# Patient Record
Sex: Female | Born: 1980
Health system: Southern US, Community
[De-identification: ages and names within clinical notes are randomized; demographics above are authoritative.]

## PROBLEM LIST (undated history)

## (undated) DIAGNOSIS — T4145XA Adverse effect of unspecified anesthetic, initial encounter: Secondary | ICD-10-CM

## (undated) DIAGNOSIS — J302 Other seasonal allergic rhinitis: Secondary | ICD-10-CM

## (undated) DIAGNOSIS — N809 Endometriosis, unspecified: Secondary | ICD-10-CM

## (undated) DIAGNOSIS — A749 Chlamydial infection, unspecified: Secondary | ICD-10-CM

## (undated) DIAGNOSIS — N871 Moderate cervical dysplasia: Secondary | ICD-10-CM

## (undated) DIAGNOSIS — R51 Headache: Secondary | ICD-10-CM

## (undated) DIAGNOSIS — N3281 Overactive bladder: Secondary | ICD-10-CM

## (undated) DIAGNOSIS — J189 Pneumonia, unspecified organism: Secondary | ICD-10-CM

## (undated) DIAGNOSIS — S62501A Fracture of unspecified phalanx of right thumb, initial encounter for closed fracture: Secondary | ICD-10-CM

## (undated) DIAGNOSIS — R519 Headache, unspecified: Secondary | ICD-10-CM

## (undated) DIAGNOSIS — B009 Herpesviral infection, unspecified: Secondary | ICD-10-CM

## (undated) DIAGNOSIS — D649 Anemia, unspecified: Secondary | ICD-10-CM

## (undated) DIAGNOSIS — M1990 Unspecified osteoarthritis, unspecified site: Secondary | ICD-10-CM

## (undated) DIAGNOSIS — K219 Gastro-esophageal reflux disease without esophagitis: Secondary | ICD-10-CM

## (undated) DIAGNOSIS — T8859XA Other complications of anesthesia, initial encounter: Secondary | ICD-10-CM

## (undated) DIAGNOSIS — R32 Unspecified urinary incontinence: Secondary | ICD-10-CM

## (undated) HISTORY — DX: Endometriosis, unspecified: N80.9

## (undated) HISTORY — DX: Chlamydial infection, unspecified: A74.9

## (undated) HISTORY — PX: TUBAL LIGATION: SHX77

## (undated) HISTORY — DX: Herpesviral infection, unspecified: B00.9

## (undated) HISTORY — DX: Overactive bladder: N32.81

## (undated) HISTORY — DX: Unspecified urinary incontinence: R32

## (undated) HISTORY — DX: Moderate cervical dysplasia: N87.1

## (undated) HISTORY — PX: COLPOSCOPY: SHX161

---

## 2000-02-22 HISTORY — PX: CERVICAL BIOPSY  W/ LOOP ELECTRODE EXCISION: SUR135

## 2002-02-06 ENCOUNTER — Other Ambulatory Visit: Admission: RE | Admit: 2002-02-06 | Discharge: 2002-02-06 | Payer: Self-pay | Admitting: *Deleted

## 2002-02-21 DIAGNOSIS — N871 Moderate cervical dysplasia: Secondary | ICD-10-CM

## 2002-02-21 HISTORY — PX: OTHER SURGICAL HISTORY: SHX169

## 2002-02-21 HISTORY — DX: Moderate cervical dysplasia: N87.1

## 2002-07-15 ENCOUNTER — Inpatient Hospital Stay (HOSPITAL_COMMUNITY): Admission: AD | Admit: 2002-07-15 | Discharge: 2002-07-15 | Payer: Self-pay | Admitting: Gynecology

## 2002-07-17 ENCOUNTER — Observation Stay (HOSPITAL_COMMUNITY): Admission: AD | Admit: 2002-07-17 | Discharge: 2002-07-18 | Payer: Self-pay | Admitting: Gynecology

## 2002-10-04 ENCOUNTER — Inpatient Hospital Stay (HOSPITAL_COMMUNITY): Admission: AD | Admit: 2002-10-04 | Discharge: 2002-10-05 | Payer: Self-pay | Admitting: Gynecology

## 2002-10-13 ENCOUNTER — Inpatient Hospital Stay (HOSPITAL_COMMUNITY): Admission: AD | Admit: 2002-10-13 | Discharge: 2002-10-15 | Payer: Self-pay | Admitting: Gynecology

## 2002-10-13 ENCOUNTER — Encounter (INDEPENDENT_AMBULATORY_CARE_PROVIDER_SITE_OTHER): Payer: Self-pay | Admitting: Specialist

## 2002-11-25 ENCOUNTER — Other Ambulatory Visit: Admission: RE | Admit: 2002-11-25 | Discharge: 2002-11-25 | Payer: Self-pay | Admitting: Gynecology

## 2003-01-21 ENCOUNTER — Encounter (INDEPENDENT_AMBULATORY_CARE_PROVIDER_SITE_OTHER): Payer: Self-pay | Admitting: *Deleted

## 2003-01-21 ENCOUNTER — Ambulatory Visit (HOSPITAL_BASED_OUTPATIENT_CLINIC_OR_DEPARTMENT_OTHER): Admission: RE | Admit: 2003-01-21 | Discharge: 2003-01-21 | Payer: Self-pay | Admitting: Gynecology

## 2003-07-28 ENCOUNTER — Other Ambulatory Visit: Admission: RE | Admit: 2003-07-28 | Discharge: 2003-07-28 | Payer: Self-pay | Admitting: Gynecology

## 2004-01-27 ENCOUNTER — Ambulatory Visit: Payer: Self-pay | Admitting: Family Medicine

## 2004-02-20 ENCOUNTER — Other Ambulatory Visit: Admission: RE | Admit: 2004-02-20 | Discharge: 2004-02-20 | Payer: Self-pay | Admitting: Gynecology

## 2004-08-02 ENCOUNTER — Ambulatory Visit: Payer: Self-pay | Admitting: Family Medicine

## 2004-08-27 ENCOUNTER — Other Ambulatory Visit: Admission: RE | Admit: 2004-08-27 | Discharge: 2004-08-27 | Payer: Self-pay | Admitting: Gynecology

## 2005-09-02 ENCOUNTER — Other Ambulatory Visit: Admission: RE | Admit: 2005-09-02 | Discharge: 2005-09-02 | Payer: Self-pay | Admitting: Gynecology

## 2006-10-11 ENCOUNTER — Other Ambulatory Visit: Admission: RE | Admit: 2006-10-11 | Discharge: 2006-10-11 | Payer: Self-pay | Admitting: Gynecology

## 2007-11-06 ENCOUNTER — Other Ambulatory Visit: Admission: RE | Admit: 2007-11-06 | Discharge: 2007-11-06 | Payer: Self-pay | Admitting: Gynecology

## 2007-11-06 ENCOUNTER — Ambulatory Visit: Payer: Self-pay | Admitting: Gynecology

## 2007-11-06 ENCOUNTER — Encounter: Payer: Self-pay | Admitting: Gynecology

## 2007-11-23 ENCOUNTER — Ambulatory Visit: Payer: Self-pay | Admitting: Gynecology

## 2008-03-10 ENCOUNTER — Ambulatory Visit: Payer: Self-pay | Admitting: Women's Health

## 2008-04-01 ENCOUNTER — Ambulatory Visit: Payer: Self-pay | Admitting: Gynecology

## 2008-04-07 ENCOUNTER — Ambulatory Visit: Payer: Self-pay | Admitting: Gynecology

## 2008-04-09 ENCOUNTER — Ambulatory Visit: Payer: Self-pay | Admitting: Gynecology

## 2008-04-15 ENCOUNTER — Ambulatory Visit: Payer: Self-pay | Admitting: Gynecology

## 2008-05-07 ENCOUNTER — Ambulatory Visit: Payer: Self-pay | Admitting: Gynecology

## 2008-05-26 ENCOUNTER — Ambulatory Visit: Payer: Self-pay | Admitting: Women's Health

## 2008-05-29 ENCOUNTER — Ambulatory Visit: Payer: Self-pay | Admitting: Gynecology

## 2008-08-22 ENCOUNTER — Ambulatory Visit: Payer: Self-pay | Admitting: Gynecology

## 2008-10-14 ENCOUNTER — Other Ambulatory Visit: Admission: RE | Admit: 2008-10-14 | Discharge: 2008-10-14 | Payer: Self-pay | Admitting: Gynecology

## 2008-10-14 ENCOUNTER — Ambulatory Visit: Payer: Self-pay | Admitting: Gynecology

## 2008-10-14 ENCOUNTER — Encounter: Payer: Self-pay | Admitting: Gynecology

## 2008-12-01 ENCOUNTER — Encounter: Payer: Self-pay | Admitting: Gynecology

## 2008-12-01 ENCOUNTER — Ambulatory Visit: Payer: Self-pay | Admitting: Gynecology

## 2008-12-01 ENCOUNTER — Other Ambulatory Visit: Admission: RE | Admit: 2008-12-01 | Discharge: 2008-12-01 | Payer: Self-pay | Admitting: Gynecology

## 2009-01-20 ENCOUNTER — Ambulatory Visit: Payer: Self-pay | Admitting: Gynecology

## 2009-05-06 ENCOUNTER — Ambulatory Visit: Payer: Self-pay | Admitting: Women's Health

## 2009-05-22 ENCOUNTER — Ambulatory Visit: Payer: Self-pay | Admitting: Women's Health

## 2009-09-28 ENCOUNTER — Ambulatory Visit: Payer: Self-pay | Admitting: Women's Health

## 2009-12-11 ENCOUNTER — Other Ambulatory Visit: Admission: RE | Admit: 2009-12-11 | Discharge: 2009-12-11 | Payer: Self-pay | Admitting: Gynecology

## 2009-12-11 ENCOUNTER — Ambulatory Visit: Payer: Self-pay | Admitting: Gynecology

## 2009-12-18 ENCOUNTER — Emergency Department (HOSPITAL_BASED_OUTPATIENT_CLINIC_OR_DEPARTMENT_OTHER)
Admission: EM | Admit: 2009-12-18 | Discharge: 2009-12-18 | Payer: Self-pay | Source: Home / Self Care | Admitting: Emergency Medicine

## 2009-12-18 ENCOUNTER — Ambulatory Visit: Payer: Self-pay | Admitting: Gynecology

## 2009-12-22 ENCOUNTER — Ambulatory Visit: Payer: Self-pay | Admitting: Gynecology

## 2010-01-01 ENCOUNTER — Ambulatory Visit: Payer: Self-pay | Admitting: Gynecology

## 2010-02-08 ENCOUNTER — Inpatient Hospital Stay (HOSPITAL_COMMUNITY)
Admission: AD | Admit: 2010-02-08 | Discharge: 2010-02-08 | Payer: Self-pay | Source: Home / Self Care | Attending: Obstetrics and Gynecology | Admitting: Obstetrics and Gynecology

## 2010-02-14 ENCOUNTER — Inpatient Hospital Stay (HOSPITAL_COMMUNITY)
Admission: AD | Admit: 2010-02-14 | Discharge: 2010-02-14 | Payer: Self-pay | Source: Home / Self Care | Attending: Obstetrics and Gynecology | Admitting: Obstetrics and Gynecology

## 2010-02-16 ENCOUNTER — Inpatient Hospital Stay (HOSPITAL_COMMUNITY)
Admission: AD | Admit: 2010-02-16 | Discharge: 2010-02-16 | Payer: Self-pay | Source: Home / Self Care | Attending: Obstetrics and Gynecology | Admitting: Obstetrics and Gynecology

## 2010-04-24 ENCOUNTER — Inpatient Hospital Stay (HOSPITAL_COMMUNITY)
Admission: AD | Admit: 2010-04-24 | Discharge: 2010-04-24 | Disposition: A | Discharge status: Home / Self Care | Payer: Managed Care, Other (non HMO) | Source: Ambulatory Visit | Attending: Obstetrics and Gynecology | Admitting: Obstetrics and Gynecology

## 2010-04-24 ENCOUNTER — Inpatient Hospital Stay (HOSPITAL_COMMUNITY): Payer: Managed Care, Other (non HMO)

## 2010-04-24 DIAGNOSIS — R109 Unspecified abdominal pain: Secondary | ICD-10-CM | POA: Insufficient documentation

## 2010-04-24 DIAGNOSIS — O99891 Other specified diseases and conditions complicating pregnancy: Secondary | ICD-10-CM | POA: Insufficient documentation

## 2010-04-24 LAB — CBC
HCT: 28.6 % — ABNORMAL LOW (ref 36.0–46.0)
Hemoglobin: 9.5 g/dL — ABNORMAL LOW (ref 12.0–15.0)
MCV: 84.1 fL (ref 78.0–100.0)
RBC: 3.4 MIL/uL — ABNORMAL LOW (ref 3.87–5.11)
RDW: 14.3 % (ref 11.5–15.5)
WBC: 10.6 10*3/uL — ABNORMAL HIGH (ref 4.0–10.5)

## 2010-04-24 LAB — DIC (DISSEMINATED INTRAVASCULAR COAGULATION)PANEL
Fibrinogen: 405 mg/dL (ref 204–475)
INR: 1.08 (ref 0.00–1.49)
Platelets: 250 10*3/uL (ref 150–400)
Smear Review: NONE SEEN
aPTT: 30 seconds (ref 24–37)

## 2010-04-24 LAB — URINALYSIS, ROUTINE W REFLEX MICROSCOPIC
Glucose, UA: NEGATIVE mg/dL
Hgb urine dipstick: NEGATIVE
Protein, ur: NEGATIVE mg/dL
pH: 7 (ref 5.0–8.0)

## 2010-04-26 ENCOUNTER — Inpatient Hospital Stay (HOSPITAL_COMMUNITY): Payer: Managed Care, Other (non HMO)

## 2010-05-03 LAB — URINALYSIS, ROUTINE W REFLEX MICROSCOPIC
Bilirubin Urine: NEGATIVE
Bilirubin Urine: NEGATIVE
Hgb urine dipstick: NEGATIVE
Ketones, ur: NEGATIVE mg/dL
Nitrite: NEGATIVE
Protein, ur: NEGATIVE mg/dL
Protein, ur: NEGATIVE mg/dL
Specific Gravity, Urine: 1.015 (ref 1.005–1.030)
Urobilinogen, UA: 0.2 mg/dL (ref 0.0–1.0)
Urobilinogen, UA: 1 mg/dL (ref 0.0–1.0)

## 2010-05-03 LAB — URINE MICROSCOPIC-ADD ON

## 2010-05-03 LAB — ABO/RH: ABO/RH(D): A POS

## 2010-05-03 LAB — WET PREP, GENITAL
Trich, Wet Prep: NONE SEEN
Yeast Wet Prep HPF POC: NONE SEEN

## 2010-05-03 LAB — GC/CHLAMYDIA PROBE AMP, GENITAL
Chlamydia, DNA Probe: UNDETERMINED
GC Probe Amp, Genital: NEGATIVE
GC Probe Amp, Genital: UNDETERMINED

## 2010-05-03 LAB — CBC
Hemoglobin: 11.1 g/dL — ABNORMAL LOW (ref 12.0–15.0)
MCH: 26.6 pg (ref 26.0–34.0)
MCHC: 33.8 g/dL (ref 30.0–36.0)
MCV: 78.5 fL (ref 78.0–100.0)
RBC: 4.18 MIL/uL (ref 3.87–5.11)

## 2010-05-05 LAB — URINALYSIS, ROUTINE W REFLEX MICROSCOPIC
Bilirubin Urine: NEGATIVE
Hgb urine dipstick: NEGATIVE
Ketones, ur: 15 mg/dL — AB
Nitrite: NEGATIVE
Specific Gravity, Urine: 1.021 (ref 1.005–1.030)
pH: 7 (ref 5.0–8.0)

## 2010-05-05 LAB — PREGNANCY, URINE: Preg Test, Ur: POSITIVE

## 2010-07-09 NOTE — Discharge Summary (Signed)
   NAMEVAUDIE, ENGEBRETSEN                            ACCOUNT NO.:  [id number]   MEDICAL RECORD NO.:  [id number]                   PATIENT TYPE:  INP   LOCATION:  9160                                 FACILITY:  [facility]   PHYSICIAN:  Ivor Costa. Farrel Gobble, M.D.              DATE OF BIRTH:  05-Jun-1980   DATE OF ADMISSION:  10/04/2002  DATE OF DISCHARGE:  10/05/2002                                 DISCHARGE SUMMARY   DISCHARGE DIAGNOSES:  1. Intrauterine pregnancy 35 weeks 5 days, undelivered.  2. Preterm labor.   HISTORY:  This is a 30 year old female gravida 2, para 0 with an EDC of  November 06, 2002.  Prenatal course complicated by prior admission for  preterm labor which she had been treated with terbutaline.  Also, had a  history of recurrent urinary tract infections which at that time had been  felt source of her contractions.  Of note, since 23 weeks the patient had  short cervix compared to her anatomy done at 19 weeks when her cervix had  been normal.  Cervix was noted to be short at 1.9 cm at 28 weeks.  The  patient had been maintained on pelvic rest, limited activity.   HOSPITAL COURSE:  On October 04, 2002 patient presented at 35 weeks and 5  days complaining of pelvic pain and pressure.  Was noted to be contracting  every one to two minutes with a vaginal examination of 2-3 cm.  Based on  that patient was admitted and begun on IV antibiotics, IV fluids and was  monitored.  On October 05, 2002 patient was feeling better, less pressure.  Nonstress test was reactive with no decelerations.  Contractions had  decreased to every 12 minutes and vaginal examination was fingertip, 80%,  and -3.  At that point the diagnosis of preterm labor with dynamic cervical  changes was made.  The patient was felt stable for discharge to home on  limited activity.  She is to follow up in the office on Monday unless she  has problems prior to that time.     Susa Loffler, P.A.                    Ivor Costa. Farrel Gobble, M.D.    TSG/MEDQ  D:  11/22/2002  T:  11/22/2002  Job:  [id number]

## 2010-07-09 NOTE — H&P (Signed)
[name], [name]                            ACCOUNT NO.:  [id number]   MEDICAL RECORD NO.:  [id number]                   PATIENT TYPE:  OBV   LOCATION:  9153                                 [facility]ACILITY:  [facility]   PHYSICIAN:  Timothy P. Fontaine, M.D.           DATE OF BIRTH:  1980/08/16   DATE OF ADMISSION:  07/17/2002  DATE OF DISCHARGE:                                HISTORY & PHYSICAL   CHIEF COMPLAINT:  Cramping.   HISTORY OF PRESENT ILLNESS:  A 30 year old at 24 weeks with lower pelvic  cramping.  The patient has been evaluated this week in the triage with some  uterine irritability, was given terbutaline x1 which resolved her cramping,  and was sent home.  She had BV per wet prep and was also given Cleocin p.o.  The patient was unable to take her Cleocin due to nausea and vomiting after  each dose and re-presented complaining of some cramping.  Her exam showed  her cervix to be closed although there was some ballooning of the lower  uterine segment and ultrasound was obtained which showed the cervix at 2.6  cm in length, closed.  External monitors did show some uterine irritability  with cramping every one to three minutes.  Of note also, her urine culture  which had been obtained in the triage area ultimately returned showing E.  coli.  The patient was admitted for IV antibiotics and p.o. terbutaline  suppression.   For the remainder of her history, see her [facility].   PHYSICAL EXAMINATION:  VITAL SIGNS:  Afebrile, vital signs are stable.  HEENT:  Normal.  LUNGS:  Clear.  CARDIAC:  Regular rate without rubs, murmurs, or gallops.  ABDOMEN:  Benign.  Uterus appropriate for dates, soft, nontender.  PELVIC:  Cervix is long, closed, with the ballooning of the lower uterine  segment.   ASSESSMENT AND PLAN:  A 30 year old at 24 weeks, urinary tract infection,  uterine irritability, ballooning of the lower uterine segment, cervix 2.7  cm, closed on ultrasound.  The patient has  received oral terbutaline 2.5  q.4h. and was begun on Cefotan 1 gram IV q.12h.  On reassessment the morning  following admission the patient was found to be without contractions and she  will receive a second dose of IV antibiotics, will be converted to p.o. of  Macrobid 100 b.i.d. x7days.  Will treat her BV with Cleocin vaginal ovules  given her intolerance to Cleocin.  Will continue her oral terbutaline for  four days to allow antibiotics to take effect and then will discontinue, and  then will be seen in the office the day following her discontinuation of her  terbutaline.  The patient was given precautions to call ASAP if any returned  cramping or discomfort or any other symptoms.  Timothy P. Audie Box, M.D.    TPF/MEDQ  D:  07/18/2002  T:  07/18/2002  Job:  [id number]

## 2010-07-09 NOTE — Discharge Summary (Signed)
   [name], [name]                            ACCOUNT NO.:  [id number]   MEDICAL RECORD NO.:  [id number]                   PATIENT TYPE:  INP   LOCATION:  9105                                 FACILITY:  [facility]   PHYSICIAN:  Ivor Costa. Farrel Gobble, M.D.              DATE OF BIRTH:  1980-11-07   DATE OF ADMISSION:  10/13/2002  DATE OF DISCHARGE:  10/15/2002                                 DISCHARGE SUMMARY   DISCHARGE DIAGNOSIS:  Intrauterine pregnancy at 36-and-a-half weeks with  labor.   PROCEDURE:  Spontaneous delivery with an episiotomy and a second degree  laceration.   HISTORY OF PRESENT ILLNESS:  A 30 year old gravida 2 para 0 at 36-and-a-half  weeks admitted with labor and 2-3 cm at admission.  She was augmented with  Pitocin and artificial rupture of membranes.  She progressed to complete.  Prenatally, her prenatal course was complicated with some preterm labor that  resolved with increased rest.   PRENATAL LABORATORY DATA:  Blood type is A positive, antibody is negative.  Serology is nonreactive.  Rubella titer immune.  Hepatitis negative.  HIV  negative.  Group B strep was unknown.   HOSPITAL COURSE AND TREATMENT:  The patient presented on October 13, 2002  with preterm labor at 67 and three days.  Her EDC was November 06, 2002.  She was found to be 2-3 cm, was admitted.  Delivered a viable female with  Apgars of 9 and 9 with a birth weight of 5 pounds 11 ounces.  Postpartum she  remained afebrile, no difficulty voiding, and was discharged in satisfactory  condition on postpartum day #2.   DISCHARGE LABORATORY DATA:  Her white count was 14.5, hemoglobin was 10.9,  hematocrit 30.9, platelets 238.   DISPOSITION:  She was discharged to home with instructions to follow up in  six weeks, continue prenatal vitamins and iron, Motrin as needed for pain,  GGA discharge booklet.     Davonna Belling. Young, N.P.                      Ivor Costa. Farrel Gobble, M.D.    [facility]  D:  10/31/2002  T:   10/31/2002  Job:  [id number]

## 2010-07-09 NOTE — Op Note (Signed)
NAME:  [name], [name]                          ACCOUNT NO.:  [id number]   MEDICAL RECORD NO.:  [id number]                   PATIENT TYPE:  AMB   LOCATION:  N[facility]                                 FACILITY:  [facility]   PHYSICIAN:  Ivor Costa. Farrel Gobble, M.D.              DATE OF BIRTH:  1980/12/01   DATE OF PROCEDURE:  01/21/2003  DATE OF DISCHARGE:                                 OPERATIVE REPORT   PREOPERATIVE DIAGNOSES:  1. Cervical intraepithelial neoplasia II on biopsy.  2. Previous loop electrosurgical excision procedure   POSTOPERATIVE DIAGNOSES:  1. Cervical intraepithelial neoplasia II on biopsy.  2. Previous loop electrosurgical excision procedure.   PROCEDURE:  Cold knife cone.   SURGEON:  Ivor Costa. Farrel Gobble, M.D.   ANESTHESIA:  MAC with paracervical block.   ESTIMATED BLOOD LOSS:  Minimal.   FINDINGS:  The uterus was retroverted, sounding 8 cm, with a scarred  cervical squamocolumnar junction.   PATHOLOGY:  Cold knife cone.   DESCRIPTION OF PROCEDURE:  The patient was taken to the operating room, MAC  anesthesia was induced, placed in the dorsal lithotomy position, prepped and  draped in the usual sterile fashion, the vagina was cleansed with Hibiclens.  A bivalve speculum was then placed in the vagina.  The cervix was markedly  deviated underneath the pubic symphysis.  The posterior aspect of the cervix  was somewhat flush to the vagina.  The cervix was stabilized with a single-  tooth tenaculum and the canal was identified with a uterine sound.  The  uterus was noted to be markedly retroflexed.  A paracervical block with  twenty micrograms in 50 mL of Pitressin solution was injected at 3 and 9  o'clock.  A sterile weighted speculum was then placed in the vagina, as was  the Sims.  The cervix was better visualized and again was re-stabilized with  a single-tooth tenaculum.  Once blanching of the cervix was appreciated, the  scar from the previous LEEP was readily visible.   The cervical mucosa was  gently scored circumferentially around this scar and then once it was able  to be grasped, it was grasped with a pickup and the cold knife cone was done  sharply, excising the previous scar.  It was removed in one piece.  It was  tagged at 12 o'clock.  The canal was then re-probed.  The cervix was noted  to be hemostatic.  It was packed with two Gelfoams, which occluded the  space, and a single suture of 2-0 chromic was placed gently to hold the  Gelfoam in place.  Next 20 mL of 0.5%  Marcaine solution was injected circumferentially for pain relief.  The  vagina was then packed with a vaginal packing with Estrace cream.  A Foley  was placed. The patient tolerated the procedure well.  Sponge, lap, and  needle counts correct x2.  She was transferred to the  PACU in stable  condition.                                               Ivor Costa. Farrel Gobble, M.D.    THL/MEDQ  D:  01/21/2003  T:  01/21/2003  Job:  [id number]

## 2010-07-09 NOTE — H&P (Signed)
NAMEPREET, MANGANO                            ACCOUNT NO.:  [id number]   MEDICAL RECORD NO.:  [id number]                   PATIENT TYPE:  INP   LOCATION:  9160                                 FACILITY:  [facility]   PHYSICIAN:  Ivor Costa. Farrel Gobble, M.D.              DATE OF BIRTH:  April 10, 1980   DATE OF ADMISSION:  10/04/2002  DATE OF DISCHARGE:                                HISTORY & PHYSICAL   CHIEF COMPLAINT:  Uterine contractions.   HISTORY OF PRESENT ILLNESS:  The patient is a 30 year old G2, P0 with an LMP  of April 01, 2001, estimated date of confinement is November 06, 2002,  estimated gestational age of 35-5/7 weeks, who called the office this  afternoon complaining of some pelvic pain and pressure. The patient was  asked to come to triage and she was noted to be contracting every 1 to 2  minutes with a vaginal examination of 2 to  3 cm. Based on that the patient  was admitted.   The patient's pregnancy has been complicated by a previous admission for  preterm labor for which she was treated with terbutaline. She has also had  recurrent urinary tract infections which was felt  to be the source of her  uterine contractions. She was not maintained on terbutaline.   Of note since 23 weeks the patient has had a shortened cervix compared to  her anatomy done at 19 weeks her cervix had been normal. The patient's  cervix was noted to be 2.6 cm. She has had multiple  ultrasounds done. Her  cervix was as short at 1.9 cm at 28 weeks.   The patient had been maintained on pelvic rest and limited activity. The  patient is without complaints. She reports good fetal movement, no vaginal  bleeding and no loss of fluid.   LABORATORY DATA:  Prenatal labs, she is A positive, antibody negative. RPR  nonreactive, rubella immune, hepatitis B surface antigen nonreactive, HIV  nonreactive, GBS not available. Please refer to [facility].  Urinalysis  shows leukocyte esterase, few WBCs and few  bacteria.   PHYSICAL EXAMINATION:  GENERAL:  She is a well appearing gravida in no acute  distress.  HEART:  Regular rate.  LUNGS:  Clear to auscultation.  ABDOMEN:  Gravid, soft, nontender.  PELVIC:  On vaginal examination  she was 280-3, posterior and hot. Membranes  were palpable. Her NST is reactive with a baseline  of 130, accelerations to  180. No decelerations. Contractions as mentioned above.  EXTREMITIES:  No edema.   ASSESSMENT:  History of recurrent urinary tract infections with previous  preterm labor, bulging lower uterine segment and shortened cervix. The  patient now has advanced  dilatation. A discussion with the patient with  regard to the fact that she is still  considered preterm. There is risk of  pulmonary immaturity versus risk of tocolysis. The patient elects to  allow  delivery if she goes into spontaneous labor. We will not augment her at this  point, however, but we will not tocolyze her.    PLAN:  She was admitted for IV antibiotics, IV fluids and we will reassess  her later during this admission.  All questions were addressed.                                               Ivor Costa. Farrel Gobble, M.D.    THL/MEDQ  D:  10/04/2002  T:  10/04/2002  Job:  [id number]

## 2010-08-07 ENCOUNTER — Inpatient Hospital Stay (HOSPITAL_COMMUNITY)
Admission: AD | Admit: 2010-08-07 | Discharge: 2010-08-07 | Disposition: A | Discharge status: Home / Self Care | Payer: Managed Care, Other (non HMO) | Source: Ambulatory Visit | Attending: Obstetrics and Gynecology | Admitting: Obstetrics and Gynecology

## 2010-08-07 DIAGNOSIS — O479 False labor, unspecified: Secondary | ICD-10-CM | POA: Insufficient documentation

## 2010-08-15 ENCOUNTER — Other Ambulatory Visit: Payer: Self-pay | Admitting: Obstetrics and Gynecology

## 2010-08-15 ENCOUNTER — Inpatient Hospital Stay (HOSPITAL_COMMUNITY)
Admission: AD | Admit: 2010-08-15 | Discharge: 2010-08-17 | DRG: 775 | Disposition: A | Discharge status: Home / Self Care | Payer: Managed Care, Other (non HMO) | Source: Ambulatory Visit | Attending: Obstetrics and Gynecology | Admitting: Obstetrics and Gynecology

## 2010-08-15 DIAGNOSIS — O9903 Anemia complicating the puerperium: Secondary | ICD-10-CM | POA: Diagnosis not present

## 2010-08-15 DIAGNOSIS — O99892 Other specified diseases and conditions complicating childbirth: Secondary | ICD-10-CM | POA: Diagnosis present

## 2010-08-15 DIAGNOSIS — Z22330 Carrier of Group B streptococcus: Secondary | ICD-10-CM

## 2010-08-15 DIAGNOSIS — D62 Acute posthemorrhagic anemia: Secondary | ICD-10-CM | POA: Diagnosis not present

## 2010-08-15 LAB — CBC
HCT: 32 % — ABNORMAL LOW (ref 36.0–46.0)
Hemoglobin: 11 g/dL — ABNORMAL LOW (ref 12.0–15.0)
MCH: 29 pg (ref 26.0–34.0)
MCHC: 34.4 g/dL (ref 30.0–36.0)
MCV: 84.4 fL (ref 78.0–100.0)
RBC: 3.79 MIL/uL — ABNORMAL LOW (ref 3.87–5.11)

## 2010-08-15 LAB — RPR: RPR Ser Ql: NONREACTIVE

## 2010-08-16 LAB — CBC
HCT: 27.7 % — ABNORMAL LOW (ref 36.0–46.0)
Platelets: 216 10*3/uL (ref 150–400)
RDW: 16.5 % — ABNORMAL HIGH (ref 11.5–15.5)
WBC: 15.2 10*3/uL — ABNORMAL HIGH (ref 4.0–10.5)

## 2010-12-14 ENCOUNTER — Other Ambulatory Visit: Payer: Self-pay | Admitting: *Deleted

## 2010-12-14 MED ORDER — MEDROXYPROGESTERONE ACETATE 150 MG/ML IM SUSP: 150.0000 mg | Freq: Once | INTRAMUSCULAR | Status: DC

## 2010-12-24 ENCOUNTER — Telehealth: Payer: Self-pay | Admitting: *Deleted

## 2010-12-24 NOTE — Telephone Encounter (Signed)
PT CALLED WANTING TO KNOW WHY SHE NEEDED TO HAVE OFFICE VISIT TO GET DEPRO PROVERA SHOT. PT SAW DR. Odis Hollingshead FOR OB AND HAD HER INJECTION DONE THERE, NOW IT IS TIME FOR HER SECOND INJECTION WITH [facility]. I EXPLAINED TO PT THAT SHE STILL WOULD NEED TO HAVE AN EXAM WITH [facility] BECAUSE LAST OFFICE VISIT WITH HIM WAS OCT 2011.HE WOULD NOT BE ABLE TO GIVE HER JUST AN INJECTION WITHOUT EXAMATION. PT IS SAID SHE IS OKAY WITH THIS AS LONG AS HER INSURANCE PAYS FOR THIS OFFICE VISIT.

## 2010-12-27 ENCOUNTER — Encounter: Payer: Self-pay | Admitting: *Deleted

## 2010-12-27 DIAGNOSIS — N3281 Overactive bladder: Secondary | ICD-10-CM | POA: Insufficient documentation

## 2010-12-27 DIAGNOSIS — B009 Herpesviral infection, unspecified: Secondary | ICD-10-CM | POA: Insufficient documentation

## 2010-12-28 ENCOUNTER — Encounter: Payer: Self-pay | Admitting: Gynecology

## 2010-12-28 ENCOUNTER — Ambulatory Visit (INDEPENDENT_AMBULATORY_CARE_PROVIDER_SITE_OTHER): Payer: Managed Care, Other (non HMO) | Admitting: Gynecology

## 2010-12-28 VITALS — BP 124/70

## 2010-12-28 DIAGNOSIS — Z3049 Encounter for surveillance of other contraceptives: Secondary | ICD-10-CM

## 2010-12-28 DIAGNOSIS — N898 Other specified noninflammatory disorders of vagina: Secondary | ICD-10-CM

## 2010-12-28 DIAGNOSIS — B373 Candidiasis of vulva and vagina: Secondary | ICD-10-CM

## 2010-12-28 DIAGNOSIS — B3731 Acute candidiasis of vulva and vagina: Secondary | ICD-10-CM

## 2010-12-28 MED ORDER — FLUCONAZOLE 150 MG PO TABS: 150.0000 mg | ORAL_TABLET | Freq: Once | ORAL | Status: AC

## 2010-12-28 MED ORDER — MEDROXYPROGESTERONE ACETATE 150 MG/ML IM SUSP
150.0000 mg | Freq: Once | INTRAMUSCULAR | Status: AC
  Administered 2010-12-28: 150 mg via INTRAMUSCULAR

## 2010-12-28 NOTE — Progress Notes (Signed)
Patient presents having undergone a vaginal delivery in June and started on Depo-Provera for contraception and is due for her shot now. She's having no complaints. She relates having a Pap smear postpartum and was told it was normal. She does have a history of significant dysplasia in the past.  Exam External BUS vagina with white discharge KOH wet prep done, cervix normal, uterus normal size midline mobile nontender, adnexa without masses or tenderness  Assessment and plan: 1. White discharge. KOH wet prep positive for yeast we'll treat with Diflucan 150x1 dose. 2. Depo-Provera contraception. Patient brought her medication with her and we'll go ahead and give her shot now she'll represent in 3 months for her next shot. She'll represent in June for her annual. Sooner if she has any issues.

## 2010-12-28 NOTE — Progress Notes (Signed)
Addended by: [place], Icesis Renn E on: 12/28/2010 01:20 PM   Modules accepted: Orders

## 2011-03-07 ENCOUNTER — Telehealth: Payer: Self-pay | Admitting: *Deleted

## 2011-03-07 NOTE — Telephone Encounter (Signed)
Lm for pt to call

## 2011-03-07 NOTE — Telephone Encounter (Signed)
Pt informed with the below note. 

## 2011-03-07 NOTE — Telephone Encounter (Signed)
Pt calling c/o spotting Jan 1st thru 10 th and full flow period on Jan 11 until now. Pt had baby on June 24 th, she uses depo provera as birth control, had 2nd shot on 12/28/10. Pt said she has been on depo-provera since 31 years old and never had any bleeding until now. Pt is concerned. Please advise

## 2011-03-07 NOTE — Telephone Encounter (Signed)
I would recommend UPT now for completeness. Not unusual to have some irregularity on Depo-Provera. I would watch for now. Irregularity continues an office visit to evaluate

## 2011-03-23 ENCOUNTER — Other Ambulatory Visit: Payer: Self-pay | Admitting: Gynecology

## 2011-03-29 ENCOUNTER — Ambulatory Visit (INDEPENDENT_AMBULATORY_CARE_PROVIDER_SITE_OTHER): Payer: Medicaid Other | Admitting: Gynecology

## 2011-03-29 DIAGNOSIS — Z309 Encounter for contraceptive management, unspecified: Secondary | ICD-10-CM

## 2011-03-29 DIAGNOSIS — IMO0001 Reserved for inherently not codable concepts without codable children: Secondary | ICD-10-CM

## 2011-03-29 MED ORDER — MEDROXYPROGESTERONE ACETATE 150 MG/ML IM SUSP
150.0000 mg | Freq: Once | INTRAMUSCULAR | Status: AC
  Administered 2011-03-29: 150 mg via INTRAMUSCULAR

## 2011-04-06 ENCOUNTER — Encounter: Payer: Self-pay | Admitting: Gynecology

## 2011-04-06 ENCOUNTER — Ambulatory Visit (INDEPENDENT_AMBULATORY_CARE_PROVIDER_SITE_OTHER): Payer: Medicaid Other | Admitting: Gynecology

## 2011-04-06 VITALS — BP 118/76

## 2011-04-06 DIAGNOSIS — N61 Mastitis without abscess: Secondary | ICD-10-CM

## 2011-04-06 MED ORDER — DICLOXACILLIN SODIUM 500 MG PO CAPS: 500.0000 mg | ORAL_CAPSULE | Freq: Four times a day (QID) | ORAL | Status: AC

## 2011-04-06 NOTE — Progress Notes (Signed)
Patient is a 31 year old who on June 2012 had a normal spontaneous vaginal delivery and has been breast feeding since then. Yesterday she felt warm and had tenderness on her left breast. She documented a temperature of 102 and defervesced after 4 hours. Since then she has noticed a reddened area as well but no bloody discharge.  Breast exam: Physical Exam  Pulmonary/Chest:     Both breasts had been examined in sitting and supine position slight asymmetry. Left breast slightly larger than right. On questioning patient this is normal for her. Erythematous area was noted from 10:00 to 12:00. On examination it was tender to touch and slightly indurated. No bloody nipple discharge was noted. Contralateral breast no palpable masses or tenderness. Bilaterally there were no supraclavicular or axillary lymphadenopathy.  Assessment: Mastitis. Patient will be placed on antibiotic consisting of dicloxacillin 500 mg 4 times a day for 10 days. If within the next 2 days patient sees no improvement she will need to return to the office for possible consideration of an incision and drainage if an abscess develops. Patient will apply moist warm compresses intermittently during the day. She will use of breast problems breast. Literature information was provided all questions were answered we'll follow accordingly.

## 2011-04-06 NOTE — Patient Instructions (Signed)
Breastfeeding, Mastitis Breastfeeding is usually the best way to feed your baby. Breastfed babies tend to be healthier and less affected by disease. Breastfed babies may have better brain development and be less likely to be overweight than formula-fed babies. Breastfeeding also benefits the mother. Breastfeeding reduces the risk of breast cancer. It will give you time to be close to your baby and helps create a strong bond. It also delays the return of your periods, stimulates your uterus to contract back to normal, and helps you lose some of the weight you gained during pregnancy. Breastfeeding should not be painful. If there is tenderness at first, it should gradually go away as the days go by. Poor latch-on and positioning are common causes of sore nipples. This is usually because the baby is not getting enough of the areola (the colored portion around the nipple) into his or her mouth, and is sucking mostly on the nipple. In general, nurse early and often, nurse with the nipple and areola in the baby's mouth, not just the nipple and feed your baby on demand. CAUSES  It is common for many women to have a plugged duct in the breast at some point if she breastfeeds. A plugged milk duct feels like a tender, sore, lump in the breast. It is not accompanied by a fever or other symptoms. It happens when a milk duct does not properly drain, and the breast becomes inflamed (red and sore). Then, pressure builds up behind the plug. A plugged duct usually only occurs in one breast at a time.   A breast infection (mastitis), on the other hand, is soreness or a lump in the breast that can be accompanied by a fever or flu-like symptoms, such as feeling run down or very achy. Some women with a breast infection also have nausea and vomiting. You also may have yellowish discharge from the nipple that looks like colostrum, or the breasts feel warm or hot to the touch and appear pink or red. A breast infection can occur when  other family members have a cold or the flu, and like a plugged duct, it usually only occurs in one breast. It is not always easy to tell the difference between a breast infection and a plugged duct because both have similar symptoms and can improve within 24 to 48 hours.   Using one position to breastfeed may not empty your breasts properly or completely and could lead to engorgement and mastitis.   Wearing a bra that is too tight may restrict the flow of your milk.   Mastitis develops because bacteria get under the skin through cracks in the nipple and into the breasts and an infection develops.  TREATMENT    Breastfeed or pump the breast more often on the affected side. This helps loosen the plug, keeps the milk moving freely, and the breast from becoming overly full. Nursing every 2 hours, both day and night on the affected side first can be helpful.   Getting extra sleep or relaxing with your feet up can help speed healing. Often, a plugged duct or breast infection is the first sign that a mother is doing too much and becoming overly tired.   Wear a well-fitting, supportive bra that is not too tight, since this can constrict milk ducts.   If you do not feel better within 24 hours of trying these steps, and you have a fever or your symptoms worsen, call your caregiver. You may need an antibiotic. If you have   a breast infection in which both breasts look affected, or there is pus or blood in the milk, red streaks near the painful area, or your symptoms came suddenly, see your caregiver right away.   Even if you need an antibiotic, continuing to breastfeed during treatment is best for both you and your baby. Most antibiotics passed in your breast milk will not hurt your baby.   Increase your fluid intake especially if you have a fever   If mastitis is not treated quickly and properly, you may develop a breast abscess.  THRUSH   Thrush (yeast) is a fungal infection that can form on your  nipples or in your breast because it thrives on milk.   The infection forms from an overgrowth of the candida organism. Candida usually lives in our bodies and is kept at healthy levels by the natural bacteria in our bodies. But, when the natural balance of bacteria is upset, candida can overgrow, causing an infection. Some of the things that can cause thrush include:    Having an overly moist environment on your skin or nipples that are sore or cracked.   Taking antibiotics, birth control pills, or steroids.   Having a diet that contains large amounts of sugar or foods with yeast.   Women with diabetes have a higher incidence of thrush and fungal infections.   Having a chronic illness like HIV infection, diabetes, or anemia.  These are symptoms of thrush:  Having sore nipples that last more than a few days, even when your baby's latch and positioning is correct.   Getting sore nipples suddenly after several weeks of normal nursing.   Pink, flaky, shiny, itchy, or cracked nipples, or deep pink and blistered nipples.   Shooting pains deep in the breast during or after feedings, or achy breasts. This may be from a candida infection of the milk ducts.   The infection can form in your baby's mouth from having contact with your nipples, and appear as little white spots on the inside of the cheeks, gums, or tongue. It also can appear as a diaper rash (small red dots around a rash) on your baby that will not go away by using regular diaper rash ointments. Many babies with thrush refuse to nurse, or are gassy or cranky.  HOME CARE INSTRUCTIONS  If you or your baby have any of the above symptoms, contact your caregiver and your baby's caregiver so you both can be correctly diagnosed.   You can get medication for your nipples and for your baby. Medication for a mother may be an ointment for the nipples, and your baby can be given a liquid medication for his or her mouth, or an ointment for any  diaper rash.   Change disposable nursing pads often and wash any towels or clothing that have come in contact with the yeast in very hot water above 122 F (50 C).   Wear a clean bra every day.   Only use cotton pads.   Wash your hands often, and wash your baby's hands often, especially if he or she sucks on his or her fingers.   Boil any pacifiers, bottle nipples, or toys your baby puts in his or her mouth once a day for 20 minutes to kill the thrush. After 1 week of treatment, discard pacifiers and nipples and buy new ones. Boil all breast pump parts that touch the milk for 20 minutes daily.   Make sure other family members are free of   fungal infections. If they have symptoms, get treatment for them.  MAKE SURE YOU:   Understand these instructions.   Will watch your condition.   Will get help right away if you are not doing well or get worse.  Document Released: 06/04/2004 Document Revised: 10/20/2010 Document Reviewed: 10/24/2007 [facility] Patient Information 2012 [place], [place].

## 2011-05-12 ENCOUNTER — Other Ambulatory Visit: Payer: Self-pay | Admitting: Women's Health

## 2011-05-12 ENCOUNTER — Telehealth: Payer: Self-pay | Admitting: *Deleted

## 2011-05-12 DIAGNOSIS — B009 Herpesviral infection, unspecified: Secondary | ICD-10-CM

## 2011-05-12 MED ORDER — VALACYCLOVIR HCL 500 MG PO TABS: ORAL_TABLET | ORAL | Status: DC

## 2011-05-12 NOTE — Telephone Encounter (Signed)
Telephone call to review request for Valtrex for rare HSV outbreaks. Also states is weaning from breast-feeding had mastitis that has now resolved.

## 2011-05-12 NOTE — Telephone Encounter (Signed)
Pt called requesting to speak with you. Call back number [phone]

## 2011-05-23 ENCOUNTER — Telehealth: Payer: Self-pay | Admitting: *Deleted

## 2011-05-23 NOTE — Telephone Encounter (Signed)
Pt called to follow when next depo-provera shot is due pt informed around 06/26/2011.

## 2011-05-31 ENCOUNTER — Telehealth: Payer: Self-pay | Admitting: *Deleted

## 2011-05-31 NOTE — Telephone Encounter (Signed)
I have no idea. The best thing would be to avoid it while she's breast-feeding.

## 2011-05-31 NOTE — Telephone Encounter (Signed)
Pt is currently breast feeding and bought OTC medication called Relora all-natural dietary supplement to alleviate stress stress- related eating and promote weight management. Pt wanted to know if you thought it would be okay to take this while breast feeding? Pt said nonething on the bottle states not to take if breast feeding. But it does say do not take if pregnant. Please advise

## 2011-05-31 NOTE — Telephone Encounter (Signed)
Pt informed with the below note. 

## 2011-06-23 ENCOUNTER — Telehealth: Payer: Self-pay | Admitting: *Deleted

## 2011-06-23 MED ORDER — MEDROXYPROGESTERONE ACETATE 150 MG/ML IM SUSP: 150.0000 mg | INTRAMUSCULAR | Status: DC

## 2011-06-23 NOTE — Telephone Encounter (Signed)
PT CALLING REQUESTING REFILL ON DEPO-PROVERA, RX SENT TO PHARMACY.

## 2011-07-04 ENCOUNTER — Ambulatory Visit (INDEPENDENT_AMBULATORY_CARE_PROVIDER_SITE_OTHER): Payer: Medicaid Other | Admitting: *Deleted

## 2011-07-04 DIAGNOSIS — Z3049 Encounter for surveillance of other contraceptives: Secondary | ICD-10-CM

## 2011-07-04 MED ORDER — MEDROXYPROGESTERONE ACETATE 150 MG/ML IM SUSP
150.0000 mg | Freq: Once | INTRAMUSCULAR | Status: AC
  Administered 2011-07-04: 150 mg via INTRAMUSCULAR

## 2011-07-04 NOTE — Progress Notes (Signed)
No intercourse in more than 2 weeks, ok to give shot per TF

## 2011-07-20 ENCOUNTER — Ambulatory Visit (INDEPENDENT_AMBULATORY_CARE_PROVIDER_SITE_OTHER): Payer: Medicaid Other | Admitting: Gynecology

## 2011-07-20 ENCOUNTER — Encounter: Payer: Self-pay | Admitting: Gynecology

## 2011-07-20 VITALS — Wt 153.0 lb

## 2011-07-20 DIAGNOSIS — IMO0001 Reserved for inherently not codable concepts without codable children: Secondary | ICD-10-CM

## 2011-07-20 DIAGNOSIS — R350 Frequency of micturition: Secondary | ICD-10-CM

## 2011-07-20 DIAGNOSIS — N390 Urinary tract infection, site not specified: Secondary | ICD-10-CM

## 2011-07-20 LAB — URINALYSIS W MICROSCOPIC + REFLEX CULTURE
Crystals: NONE SEEN
Nitrite: NEGATIVE
Specific Gravity, Urine: 1.02 (ref 1.005–1.030)
Urobilinogen, UA: 1 mg/dL (ref 0.0–1.0)

## 2011-07-20 MED ORDER — NITROFURANTOIN MONOHYD MACRO 100 MG PO CAPS: 100.0000 mg | ORAL_CAPSULE | Freq: Two times a day (BID) | ORAL | Status: AC

## 2011-07-20 NOTE — Patient Instructions (Signed)
Take antibiotics for UTI as prescribed. Call if symptoms persist. Stop breast-feeding with tight bra to stop milk production as discussed.

## 2011-07-20 NOTE — Progress Notes (Signed)
Patient presents one-day history of frequency and end stream dysuria. History of UTIs in the past and feels like she is coming down with an early UTI. No fever chills nausea vomiting or other constitutional symptoms. Also had questions about stopping breast-feeding. She is still producing milk although admits to breast-feeding at night and wonders how she can stop producing milk.  Exam Spine straight without CVA tenderness Abdomen soft nontender without masses guarding rebound organomegaly.  UA consistent with UTI  Assessment and plan: 1. UTI. UA and symptoms consistent with UTI. We'll treat with Macrobid 100 mg twice a day x7 days. Follow up if symptoms persist or recur. 2. Discussed breast feeding. The need to abruptly stop and bind breasts was reviewed with her.  As long as she is breast-feeding even once daily that she will probably continue to produce milk.

## 2011-08-02 ENCOUNTER — Telehealth: Payer: Self-pay | Admitting: *Deleted

## 2011-08-02 NOTE — Telephone Encounter (Signed)
Pt informed with the below note. 

## 2011-08-02 NOTE — Telephone Encounter (Signed)
Pt is still currently nursing and would like to know if okay to use Claritin for her allergies?

## 2011-08-02 NOTE — Telephone Encounter (Signed)
Okay to use Claritin

## 2011-09-19 ENCOUNTER — Ambulatory Visit: Payer: Medicaid Other | Admitting: Gynecology

## 2011-09-22 ENCOUNTER — Other Ambulatory Visit: Payer: Self-pay | Admitting: Gynecology

## 2011-09-23 ENCOUNTER — Encounter: Payer: Self-pay | Admitting: Gynecology

## 2011-09-23 ENCOUNTER — Ambulatory Visit (INDEPENDENT_AMBULATORY_CARE_PROVIDER_SITE_OTHER): Payer: Medicaid Other | Admitting: Gynecology

## 2011-09-23 ENCOUNTER — Other Ambulatory Visit (HOSPITAL_COMMUNITY)
Admission: RE | Admit: 2011-09-23 | Discharge: 2011-09-23 | Disposition: A | Discharge status: Home / Self Care | Payer: Medicaid Other | Source: Ambulatory Visit | Attending: Gynecology | Admitting: Gynecology

## 2011-09-23 VITALS — BP 114/70 | Ht 63.0 in | Wt 150.0 lb

## 2011-09-23 DIAGNOSIS — N871 Moderate cervical dysplasia: Secondary | ICD-10-CM

## 2011-09-23 DIAGNOSIS — Z1151 Encounter for screening for human papillomavirus (HPV): Secondary | ICD-10-CM | POA: Insufficient documentation

## 2011-09-23 DIAGNOSIS — Z3049 Encounter for surveillance of other contraceptives: Secondary | ICD-10-CM

## 2011-09-23 DIAGNOSIS — Z124 Encounter for screening for malignant neoplasm of cervix: Secondary | ICD-10-CM

## 2011-09-23 DIAGNOSIS — Z01419 Encounter for gynecological examination (general) (routine) without abnormal findings: Secondary | ICD-10-CM | POA: Insufficient documentation

## 2011-09-23 MED ORDER — MEDROXYPROGESTERONE ACETATE 150 MG/ML IM SUSP
150.0000 mg | Freq: Once | INTRAMUSCULAR | Status: AC
  Administered 2011-09-23: 150 mg via INTRAMUSCULAR

## 2011-09-23 NOTE — Patient Instructions (Signed)
Follow up with your contraceptive decisions and management plan. Otherwise follow up in one year.

## 2011-09-23 NOTE — Progress Notes (Signed)
[name] [birth year] [id number]        31 y.o.  [id number] for follow up exam.  Several issues noted below.  Past medical history,surgical history, medications, allergies, family history and social history were all reviewed and documented in the EPIC chart. ROS:  Was performed and pertinent positives and negatives are included in the history.  Exam: [name] assistant Filed Vitals:   09/23/11 1009  BP: 114/70  Height: 5\' 3"  (1.6 m)  Weight: 150 lb (68.04 kg)   General appearance  Normal Skin grossly normal Head/Neck normal with no cervical or supraclavicular adenopathy thyroid normal Lungs  clear Cardiac RR, without RMG Abdominal  soft, nontender, without masses, organomegaly or hernia Breasts  examined lying and sitting without masses, retractions or axillary adenopathy.  Pelvic  Ext/BUS/vagina  normal   Cervix  Flush with the upper vagina difficult to visualize.  Pap/HPV  Uterus  retroverted, normal size, shape and contour, midline and mobile nontender   Adnexa  Without masses or tenderness    Anus and perineum  normal   Rectovaginal  normal sphincter tone without palpated masses or tenderness.    Assessment/Plan:  31 y.o. [name] female for annual exam.   1. Depo-Provera. Patient currently on Depo-Provera for the past year since delivery. Amenorrheic. Due for shot today at 12 weeks and will receive this. We discussed contraceptive options. She is considering sterilization. I reviewed other options to include pill patch ring, continuing Depo-Provera, Implanon, Mirena IUD. She does have a history of heavy periods and the risk of returning menorrhagia with sterilization was reviewed. She's considering the Mirena IUD. Her cervix is flush with the upper vagina but I am able to negotiate the canal and I think IUD placement would be reasonable. She will read the literature and follow up if she decides to pursue this. I did review the risks of prolonged Depo-Provera in excess of 2 years with  the black box FDA warning due to calcium balance issues. 2. STD screening. She does have a history of Chlamydia in the past. STD screening offered and declined. 3. Breast health. SBE monthly reviewed. Currently weaning from breast-feeding. 4. Pap smear.  History of recurrent cervical dysplasia in the past with initial LEEP 2002 and then follow up cold knife cone in 2004 for CIN-2. Pap smears have all been negative since then with multiple Pap smears in her record, last Pap smear 2011. Pap/HPV done today. She does have significant scarring with cervix flush with the upper vagina. 5. Health maintenance. Patient will continue see her primary physician for routine health maintenance and the blood work was done today. Patient will follow up with contraceptive decision otherwise in one year.    Leah Lords MD, 11:22 AM 09/23/2011

## 2011-11-24 ENCOUNTER — Telehealth: Payer: Self-pay | Admitting: *Deleted

## 2011-11-24 MED ORDER — ETONOGESTREL-ETHINYL ESTRADIOL 0.12-0.015 MG/24HR VA RING: 1.0000 | VAGINAL_RING | VAGINAL | Status: DC

## 2011-11-24 NOTE — Telephone Encounter (Signed)
Pt is calling to request Rx for nuvaring until she could come in to have her IUD placed. Pt started a new job and unable to take a day off. 1 nuvaring will be sent to pharmacy, pt has been on this before and has discussed this birth control with TF at 09/23/11 OV.

## 2011-12-02 ENCOUNTER — Other Ambulatory Visit: Payer: Self-pay | Admitting: Gynecology

## 2011-12-02 ENCOUNTER — Telehealth: Payer: Self-pay | Admitting: Gynecology

## 2011-12-02 DIAGNOSIS — Z3049 Encounter for surveillance of other contraceptives: Secondary | ICD-10-CM

## 2011-12-02 MED ORDER — LEVONORGESTREL 20 MCG/24HR IU IUD: INTRAUTERINE_SYSTEM | Freq: Once | INTRAUTERINE | Status: DC

## 2011-12-02 NOTE — Telephone Encounter (Signed)
12/02/11-Per pt new BC ins Mirena & insertion are covered under her $30.00 [phone].Left VM for pt with this info. She already has insertion appointment for Monday 12/05/11.[facility]

## 2011-12-05 ENCOUNTER — Ambulatory Visit (INDEPENDENT_AMBULATORY_CARE_PROVIDER_SITE_OTHER): Payer: BC Managed Care – PPO | Admitting: Gynecology

## 2011-12-05 ENCOUNTER — Encounter: Payer: Self-pay | Admitting: Gynecology

## 2011-12-05 DIAGNOSIS — Z30430 Encounter for insertion of intrauterine contraceptive device: Secondary | ICD-10-CM

## 2011-12-05 LAB — PREGNANCY, URINE: Preg Test, Ur: NEGATIVE

## 2011-12-05 NOTE — Progress Notes (Signed)
Patient presented to the office for placement of Mirena IUD. Please see last encounter note by Dr. Colin Broach 09/23/2011. Patient came off the NuvaRing today. Urine pregnancy test was negative today. Patient was given literature formation on the Mirena IUD. Risks benefits and pros and cons were discussed. Patient fully where this form of contraception is 99% effective and is good for 5 years.  Patient with prior history of cervical dysplasia as resulting in LEEP and cold knife cervical conization for CIN-2 back in 2004. Followup Pap smears been normal.  UPPP and office negative today  Exam: Bartholin urethra Skene and: Within normal limits Vagina: No lesions or discharge Cervix: Flushed with upper vagina was able to be seen completely when patient was placed on the McRoberts position. Uterus: Slightly retroverted no palpable masses or tenderness Adnexa: No palpable masses or tenderness Rectal: Not examined  Procedure note: The cervix was cleansed with Betadine solution. We'll percent lidocaine was infiltrated into the cervical vaginal stroma in a paracervical block fashion at the 240 and 10:00 position for approximately 8 cc. A single-tooth tenaculum was placed on the anterior cervical lip. The uterus sounded to 7 cm. The Mirena IUD was placed in sterile fashion and the string was trimmed. The single-tooth tenaculum was removed. Patient tolerated procedure well without any complications. She was given an Aleve for some mild pelvic discomfort. She'll return back in a week for followup.

## 2011-12-05 NOTE — Patient Instructions (Addendum)
Intrauterine Device Information  An intrauterine device (IUD) is inserted into your uterus and prevents pregnancy. There are 2 types of IUDs available:  · Copper IUD. This type of IUD is wrapped in copper wire and is placed inside the uterus. Copper makes the uterus and fallopian tubes produce a fluid that kills sperm. The copper IUD can stay in place for 10 years.  · Hormone IUD. This type of IUD contains the hormone progestin (synthetic progesterone). The hormone thickens the cervical mucus and prevents sperm from entering the uterus, and it also thins the uterine lining to prevent implantation of a fertilized egg. The hormone can weaken or kill the sperm that get into the uterus. The hormone IUD can stay in place for 5 years.  Your caregiver will make sure you are a good candidate for a contraceptive IUD. Discuss with your caregiver the possible side effects.  ADVANTAGES  · It is highly effective, reversible, long-acting, and low maintenance.  · There are no estrogen-related side effects.  · An IUD can be used when breastfeeding.  · It is not associated with weight gain.  · It works immediately after insertion.  · The copper IUD does not interfere with your female hormones.  · The progesterone IUD can make heavy menstrual periods lighter.  · The progesterone IUD can be used for 5 years.  · The copper IUD can be used for 10 years.  DISADVANTAGES  · The progesterone IUD can be associated with irregular bleeding patterns.  · The copper IUD can make your menstrual flow heavier and more painful.  · You may experience cramping and vaginal bleeding after insertion.  Document Released: 01/12/2004 Document Revised: 05/02/2011 Document Reviewed: 06/12/2010  ExitCare® Patient Information ©2013 [employer].

## 2011-12-06 ENCOUNTER — Encounter: Payer: Self-pay | Admitting: Gynecology

## 2011-12-27 ENCOUNTER — Emergency Department (HOSPITAL_BASED_OUTPATIENT_CLINIC_OR_DEPARTMENT_OTHER): Payer: BC Managed Care – PPO

## 2011-12-27 ENCOUNTER — Emergency Department (HOSPITAL_BASED_OUTPATIENT_CLINIC_OR_DEPARTMENT_OTHER)
Admission: EM | Admit: 2011-12-27 | Discharge: 2011-12-27 | Disposition: A | Discharge status: Home / Self Care | Payer: BC Managed Care – PPO | Attending: Emergency Medicine | Admitting: Emergency Medicine

## 2011-12-27 ENCOUNTER — Encounter (HOSPITAL_BASED_OUTPATIENT_CLINIC_OR_DEPARTMENT_OTHER): Payer: Self-pay | Admitting: Family Medicine

## 2011-12-27 DIAGNOSIS — Y929 Unspecified place or not applicable: Secondary | ICD-10-CM | POA: Insufficient documentation

## 2011-12-27 DIAGNOSIS — Z79899 Other long term (current) drug therapy: Secondary | ICD-10-CM | POA: Insufficient documentation

## 2011-12-27 DIAGNOSIS — Z8619 Personal history of other infectious and parasitic diseases: Secondary | ICD-10-CM | POA: Insufficient documentation

## 2011-12-27 DIAGNOSIS — S93409A Sprain of unspecified ligament of unspecified ankle, initial encounter: Secondary | ICD-10-CM | POA: Insufficient documentation

## 2011-12-27 DIAGNOSIS — Y939 Activity, unspecified: Secondary | ICD-10-CM | POA: Insufficient documentation

## 2011-12-27 DIAGNOSIS — Z8742 Personal history of other diseases of the female genital tract: Secondary | ICD-10-CM | POA: Insufficient documentation

## 2011-12-27 DIAGNOSIS — W010XXA Fall on same level from slipping, tripping and stumbling without subsequent striking against object, initial encounter: Secondary | ICD-10-CM | POA: Insufficient documentation

## 2011-12-27 MED ORDER — HYDROCODONE-ACETAMINOPHEN 5-325 MG PO TABS: 2.0000 | ORAL_TABLET | ORAL | Status: DC | PRN

## 2011-12-27 MED ORDER — IBUPROFEN 600 MG PO TABS: 600.0000 mg | ORAL_TABLET | Freq: Four times a day (QID) | ORAL | Status: DC | PRN

## 2011-12-27 NOTE — ED Provider Notes (Signed)
History     CSN: [id number]  Arrival date & time 12/27/11  1125   First MD Initiated Contact with Patient 12/27/11 1138      Chief Complaint  Patient presents with  . Ankle Pain    HPI Patient tripped on a child's toy last night and suffered an injury to her left ankle.  Her chief complaint is pain to the left ankle.  Pain is worse when she ambulates.  Better when she rests. Past Medical History  Diagnosis Date  . Overactive bladder   . HSV-2 (herpes simplex virus 2) infection   . Chlamydia 2006, 2008  . Dysplasia of cervix, low grade (CIN 1)   . CIN II (cervical intraepithelial neoplasia II) 2004    Past Surgical History  Procedure Date  . Ckc 2004  . Cervical biopsy  w/ loop electrode excision 2002    x2    Family History  Problem Relation Age of Onset  . Breast cancer Mother     History  Substance Use Topics  . Smoking status: Never Smoker   . Smokeless tobacco: Never Used  . Alcohol Use: No    OB History    Grav Para Term Preterm Abortions TAB SAB Ect Mult Living   4 2   2     2       Review of Systems All other systems reviewed and are negative Allergies  Latex  Home Medications   Current Outpatient Rx  Name  Route  Sig  Dispense  Refill  . CETIRIZINE HCL 10 MG PO TABS   Oral   Take 10 mg by mouth daily.         . ETONOGESTREL-ETHINYL ESTRADIOL 0.12-0.015 MG/24HR VA RING   Vaginal   Place 1 each vaginally every 28 (twenty-eight) days. Insert vaginally and leave in place for 3 consecutive weeks, then remove for 1 week.   1 each   0   . HYDROCODONE-ACETAMINOPHEN 5-325 MG PO TABS   Oral   Take 2 tablets by mouth every 4 (four) hours as needed for pain.   10 tablet   0   . IBUPROFEN 600 MG PO TABS   Oral   Take 1 tablet (600 mg total) by mouth every 6 (six) hours as needed for pain.   30 tablet   0   . MEDROXYPROGESTERONE ACETATE 150 MG/ML IM SUSP      INJECT 1 ML (150 MG TOTAL) INTO THE MUSCLE EVERY 3 (THREE) MONTHS.   1 mL   0     Annual exam due   . VALACYCLOVIR HCL 500 MG PO TABS      Take twice daily for 3-5 days when necessary   30 tablet   1     BP 119/75  Pulse 90  Temp 97.9 F (36.6 C) (Oral)  Resp 16  SpO2 100%  Physical Exam  Nursing note and vitals reviewed. Constitutional: She is oriented to person, place, and time. She appears well-developed and well-nourished. No distress.  HENT:  Head: Normocephalic and atraumatic.  Eyes: Pupils are equal, round, and reactive to light.  Neck: Normal range of motion.  Pulmonary/Chest: No respiratory distress.  Abdominal: Normal appearance. She exhibits no distension.  Musculoskeletal:       Left ankle: She exhibits no swelling and no deformity. tenderness (Tenderness primarily over the anterior talofibular ligament).       Feet:  Neurological: She is alert and oriented to person, place, and time. No cranial nerve  deficit.  Skin: Skin is warm and dry. No rash noted.  Psychiatric: She has a normal mood and affect. Her behavior is normal.    ED Course  Procedures (including critical care time)  Labs Reviewed - No data to display Dg Ankle Complete Left  12/27/2011  *RADIOLOGY REPORT*  Clinical Data: Pain post trauma  LEFT ANKLE COMPLETE - 3+ VIEW  Comparison: None.  Findings: Frontal, oblique, and lateral views were obtained.  No fracture or effusion.  Ankle mortise appears intact.  IMPRESSION: No abnormality noted.   Original Report Authenticated By: Bretta Bang, M.D.      1. Ankle sprain       MDM        Nelia Shi, MD 12/27/11 1606

## 2011-12-27 NOTE — ED Notes (Addendum)
Pt c/o left heel and ankle pain after tripping over something last night and falling. Pt took ibuprofen which helped with pain.

## 2012-01-02 ENCOUNTER — Ambulatory Visit: Payer: BC Managed Care – PPO | Admitting: Gynecology

## 2012-03-23 ENCOUNTER — Ambulatory Visit: Payer: BC Managed Care – PPO | Admitting: Gynecology

## 2012-04-09 ENCOUNTER — Ambulatory Visit: Payer: BC Managed Care – PPO | Admitting: Gynecology

## 2012-04-09 ENCOUNTER — Encounter: Payer: Self-pay | Admitting: Gynecology

## 2012-04-09 ENCOUNTER — Ambulatory Visit (INDEPENDENT_AMBULATORY_CARE_PROVIDER_SITE_OTHER): Payer: BC Managed Care – PPO | Admitting: Gynecology

## 2012-04-09 VITALS — BP 124/80

## 2012-04-09 DIAGNOSIS — Z975 Presence of (intrauterine) contraceptive device: Secondary | ICD-10-CM

## 2012-04-09 DIAGNOSIS — N921 Excessive and frequent menstruation with irregular cycle: Secondary | ICD-10-CM

## 2012-04-09 NOTE — Progress Notes (Signed)
Patient presented to the office today with questions on her Mirena IUD that was placed back in October 2013.she states at times she has low pelvic pressure. She also stated that after the Mirena IUD was placed in October she spotted for 2 days in November. She had no menstrual cycle in December or January. And she stated that she started her cycle today. She was also mention that her IUD string was felt by her partner during intercourse.  Exam: Abdomen soft nontender no rebound or guarding Pelvic: Bartholin urethra Skene was within normal limits Cervix: IUD string was visualized and trimmed Uterus: Slightly retroverted no tenderness Adnexa: No palpable masses or tenderness  Assessment/plan: IUD string was trimmed. Patient will return back to the office later this week for an ultrasound to confirm that the IUD is in the right position and has not migrated. Patient was reassured that the IUD is in the proper position she should stay the course. Prior to have the Mirena IUD she is to have very heavy menstrual cycles with a lot of cramping so it appearsit has been helping so far.

## 2012-04-09 NOTE — Patient Instructions (Addendum)

## 2012-04-18 ENCOUNTER — Encounter: Payer: Self-pay | Admitting: Gynecology

## 2012-04-18 ENCOUNTER — Ambulatory Visit (INDEPENDENT_AMBULATORY_CARE_PROVIDER_SITE_OTHER): Payer: BC Managed Care – PPO | Admitting: Gynecology

## 2012-04-18 ENCOUNTER — Ambulatory Visit (INDEPENDENT_AMBULATORY_CARE_PROVIDER_SITE_OTHER): Payer: BC Managed Care – PPO

## 2012-04-18 DIAGNOSIS — N921 Excessive and frequent menstruation with irregular cycle: Secondary | ICD-10-CM

## 2012-04-18 DIAGNOSIS — N854 Malposition of uterus: Secondary | ICD-10-CM

## 2012-04-18 DIAGNOSIS — Z975 Presence of (intrauterine) contraceptive device: Secondary | ICD-10-CM

## 2012-04-18 MED ORDER — DOXYCYCLINE HYCLATE 100 MG PO CAPS: 100.0000 mg | ORAL_CAPSULE | Freq: Two times a day (BID) | ORAL | Status: DC

## 2012-04-18 MED ORDER — FLUCONAZOLE 100 MG PO TABS: ORAL_TABLET | ORAL | Status: DC

## 2012-04-18 MED ORDER — ESTRADIOL 1 MG PO TABS: 1.0000 mg | ORAL_TABLET | Freq: Every day | ORAL | Status: DC

## 2012-04-18 NOTE — Progress Notes (Signed)
Patient presented to the office today for an ultrasoundas a result of her office visit on February 17. Patient questions whether her Mirena IUD that was placed in October 2013 was still in place because she felt occasional low pelvic pressure. She stated she spotted for 2 days in November and had no menstrual cycle in December or January. At time of that office visit she is to start her menstrual cycle.  Ultrasound today: Uterus measures 6.4 x 5.5 x 3.6 cm with endometrial stripe of 2.4 mm. Right ovary normal left ovary with several follicles one measuring 17 x 16 mm. Arm after free fluid was noted the cul-de-sac.IUD seen in place.  Assessment/plan: Patient with breakthrough bleeding on the Mirena IUD. With the fluid seen in the cul-de-sac it appears she may have had a small cysts and ruptured this may contribute to her symptoms. In the event of a mild endometritis she will be placed on Vibramycin 100 mg one by mouth twice a day for 7 days. To build up her endometrium and cut down her bleeding she will be placed on Estrace 1 mg one by mouth daily for 30 days. The risks benefits and pros and cons were discussed. Patient would know prior history of blood clot or bleeding disorders in her family. Patient otherwise scheduled to return back at the end of the year for her annual exam or when necessary. Of note she was given prescription of Diflucan because she states that she gets these infections when she takes antibiotics.

## 2012-10-12 ENCOUNTER — Encounter: Payer: Self-pay | Admitting: Gynecology

## 2012-10-12 ENCOUNTER — Ambulatory Visit (INDEPENDENT_AMBULATORY_CARE_PROVIDER_SITE_OTHER): Payer: BC Managed Care – PPO | Admitting: Gynecology

## 2012-10-12 ENCOUNTER — Other Ambulatory Visit (HOSPITAL_COMMUNITY)
Admission: RE | Admit: 2012-10-12 | Discharge: 2012-10-12 | Disposition: A | Discharge status: Home / Self Care | Payer: BC Managed Care – PPO | Source: Ambulatory Visit | Attending: Gynecology | Admitting: Gynecology

## 2012-10-12 VITALS — BP 110/70 | Ht 63.0 in | Wt 155.0 lb

## 2012-10-12 DIAGNOSIS — Z30432 Encounter for removal of intrauterine contraceptive device: Secondary | ICD-10-CM

## 2012-10-12 DIAGNOSIS — Z1151 Encounter for screening for human papillomavirus (HPV): Secondary | ICD-10-CM | POA: Insufficient documentation

## 2012-10-12 DIAGNOSIS — Z01419 Encounter for gynecological examination (general) (routine) without abnormal findings: Secondary | ICD-10-CM | POA: Insufficient documentation

## 2012-10-12 DIAGNOSIS — T8389XS Other specified complication of genitourinary prosthetic devices, implants and grafts, sequela: Secondary | ICD-10-CM

## 2012-10-12 DIAGNOSIS — Z13220 Encounter for screening for lipoid disorders: Secondary | ICD-10-CM

## 2012-10-12 MED ORDER — ETONOGESTREL-ETHINYL ESTRADIOL 0.12-0.015 MG/24HR VA RING: VAGINAL_RING | VAGINAL | Status: DC

## 2012-10-12 NOTE — Addendum Note (Signed)
Addended by: Dayna Barker on: 10/12/2012 04:42 PM   Modules accepted: Orders

## 2012-10-12 NOTE — Progress Notes (Signed)
[name] Jul 17, 1980 [id number]        32 y.o.  [name] for annual exam.  Several issues below.  Past medical history,surgical history, medications, allergies, family history and social history were all reviewed and documented in the EPIC chart.  ROS:  Performed and pertinent positives and negatives are included in the history, assessment and plan .  Exam: Kim assistant Filed Vitals:   10/12/12 1558  BP: 110/70  Height: 5\' 3"  (1.6 m)  Weight: 155 lb (70.308 kg)   General appearance  Normal Skin grossly normal Head/Neck normal with no cervical or supraclavicular adenopathy thyroid normal Lungs  clear Cardiac RR, without RMG Abdominal  soft, nontender, without masses, organomegaly or hernia Breasts  examined lying and sitting without masses, retractions, discharge or axillary adenopathy. Pelvic  Ext/BUS/vagina  normal   Cervix  scarred and flush with the upper vagina. IUD string not initially visible. Pap/HPV  Uterus  axial to anteverted, normal size, shape and contour, midline and mobile nontender   Adnexa  Without masses or tenderness    Anus and perineum  normal   Rectovaginal  normal sphincter tone without palpated masses or tenderness.    Assessment/Plan:  32 y.o. [id number] female for annual exam.   1. IUD complication. Patient continues to have bleeding on and off with her Mirena IUD and wants to have it removed. It's been in place for almost a year and she continues to spot eradically throughout the month. She wants to restart NuvaRing which she has used in the past and done well with this. Subsequently the cervix was visualized and the cervical os probed with a [place] forceps and the IUD string was grasped and the Mirena IUD was removed, shown to the patient and discarded. Sample pack of NuvaRing given initial placement now. Proper use instructions reviewed and I refilled her x1 year. 2. Maternal history of breast cancer age 32/36. She has lost contact with her mother and  does not have contact with her mother's side of the family. Reviewed possible genetic linkage and options for BRCA testing. Implications of testing discussed. Ultimately patient decided to have drawn him go ahead and do this today. I did recommend that she schedule a mammogram now and we will plan on annual mammography. SBE monthly reviewed. 3. Pap smear/HPV 2013 negative. Pap smear was repeated today.History of recurrent cervical dysplasia in the past with initial LEEP 2002 and then follow up cold knife cone in 2004 for CIN-2. Pap smears have all been negative since then with multiple Pap smears. We'll plan less frequent screening intervals assuming this Pap smear is normal. 4. History of HSV. Never had a recurrence after her initial outbreak. Will call if she has recurrence for Valtrex. 5. Health maintenance. Baseline CBC comprehensive metabolic panel lipid profile urinalysis ordered. Followup in one year, sooner as needed.  Note: This document was prepared with digital dictation and possible smart phrase technology. Any transcriptional errors that result from this process are unintentional.   [name], 4:33 PM 10/12/2012

## 2012-10-12 NOTE — Patient Instructions (Signed)
Call to Schedule your mammogram  Facilities in [place]: 1)  [facility], [address] Phone: [phone] 2)  [facility], [address] Phone: [phone] 3)  Dr. Bertrand at [facility]  [address] Phone: [phone]     Mammogram A mammogram is an X-ray test to find changes in a woman's breast. You should get a mammogram if:  You are 40 years of age or older  You have risk factors.   Your doctor recommends that you have one.  BEFORE THE TEST  Do not schedule the test the week before your period, especially if your breasts are sore during this time.  On the day of your mammogram:  Wash your breasts and armpits well. After washing, do not put on any deodorant or talcum powder on until after your test.   Eat and drink as you usually do.   Take your medicines as usual.   If you are diabetic and take insulin, make sure you:   Eat before coming for your test.   Take your insulin as usual.   If you cannot keep your appointment, call before the appointment to cancel. Schedule another appointment.  TEST  You will need to undress from the waist up. You will put on a hospital gown.   Your breast will be put on the mammogram machine, and it will press firmly on your breast with a piece of plastic called a compression paddle. This will make your breast flatter so that the machine can X-ray all parts of your breast.   Both breasts will be X-rayed. Each breast will be X-rayed from above and from the side. An X-ray might need to be taken again if the picture is not good enough.   The mammogram will last about 15 to 30 minutes.  AFTER THE TEST Finding out the results of your test Ask when your test results will be ready. Make sure you get your test results.  Document Released: 05/06/2008 Document Revised: 01/27/2011 Document Reviewed: 05/06/2008 ExitCare Patient  Information 2012 [employer].   

## 2012-10-13 LAB — URINALYSIS W MICROSCOPIC + REFLEX CULTURE
Casts: NONE SEEN
Crystals: NONE SEEN
Hgb urine dipstick: NEGATIVE
Leukocytes, UA: NEGATIVE
Nitrite: NEGATIVE
Specific Gravity, Urine: 1.02 (ref 1.005–1.030)
Urobilinogen, UA: 0.2 mg/dL (ref 0.0–1.0)
pH: 8 (ref 5.0–8.0)

## 2012-10-15 ENCOUNTER — Telehealth: Payer: Self-pay

## 2012-10-15 NOTE — Telephone Encounter (Signed)
Patient called to ask if we have "multiuse savings coupon" for Nuvaring.  Her friend at work received one from her doctor's office. I checked and we did have those available and I will mail it to her.

## 2012-10-24 ENCOUNTER — Encounter: Payer: Self-pay | Admitting: Gynecology

## 2012-10-24 ENCOUNTER — Telehealth: Payer: Self-pay | Admitting: *Deleted

## 2012-10-24 NOTE — Telephone Encounter (Signed)
Pt informed with recent pap results on 10/12/12.

## 2012-10-31 ENCOUNTER — Telehealth: Payer: Self-pay | Admitting: *Deleted

## 2012-10-31 MED ORDER — ETONOGESTREL-ETHINYL ESTRADIOL 0.12-0.015 MG/24HR VA RING: VAGINAL_RING | VAGINAL | Status: DC

## 2012-10-31 NOTE — Telephone Encounter (Signed)
Pt called requesting her nuvaring sent to a new pharmacy at [facility]. Rx sent with refills since annual was in August.

## 2012-12-14 ENCOUNTER — Telehealth: Payer: Self-pay | Admitting: *Deleted

## 2012-12-14 DIAGNOSIS — B009 Herpesviral infection, unspecified: Secondary | ICD-10-CM

## 2012-12-14 MED ORDER — VALACYCLOVIR HCL 500 MG PO TABS: ORAL_TABLET | ORAL | Status: DC

## 2012-12-14 NOTE — Telephone Encounter (Signed)
Pt called requesting refill on valtrex, pt up to date on annual 10/12/12. Rx will be sent.

## 2013-02-21 DIAGNOSIS — N809 Endometriosis, unspecified: Secondary | ICD-10-CM

## 2013-02-21 HISTORY — DX: Endometriosis, unspecified: N80.9

## 2013-05-03 ENCOUNTER — Ambulatory Visit: Payer: BC Managed Care – PPO | Admitting: Women's Health

## 2013-05-06 ENCOUNTER — Encounter: Payer: Self-pay | Admitting: Women's Health

## 2013-05-07 ENCOUNTER — Ambulatory Visit: Payer: BC Managed Care – PPO | Admitting: Women's Health

## 2013-10-14 ENCOUNTER — Other Ambulatory Visit (HOSPITAL_COMMUNITY)
Admission: RE | Admit: 2013-10-14 | Discharge: 2013-10-14 | Disposition: A | Discharge status: Home / Self Care | Payer: BC Managed Care – PPO | Source: Ambulatory Visit | Attending: Gynecology | Admitting: Gynecology

## 2013-10-14 ENCOUNTER — Ambulatory Visit (INDEPENDENT_AMBULATORY_CARE_PROVIDER_SITE_OTHER): Payer: BC Managed Care – PPO | Admitting: Gynecology

## 2013-10-14 ENCOUNTER — Encounter: Payer: Self-pay | Admitting: Gynecology

## 2013-10-14 VITALS — BP 120/78 | Ht 63.0 in | Wt 163.0 lb

## 2013-10-14 DIAGNOSIS — Z01419 Encounter for gynecological examination (general) (routine) without abnormal findings: Secondary | ICD-10-CM | POA: Insufficient documentation

## 2013-10-14 DIAGNOSIS — Z309 Encounter for contraceptive management, unspecified: Secondary | ICD-10-CM

## 2013-10-14 LAB — CBC WITH DIFFERENTIAL/PLATELET
BASOS ABS: 0 10*3/uL (ref 0.0–0.1)
Basophils Relative: 0 % (ref 0–1)
EOS PCT: 3 % (ref 0–5)
Eosinophils Absolute: 0.3 10*3/uL (ref 0.0–0.7)
HEMATOCRIT: 38.7 % (ref 36.0–46.0)
HEMOGLOBIN: 13.3 g/dL (ref 12.0–15.0)
LYMPHS ABS: 2.4 10*3/uL (ref 0.7–4.0)
Lymphocytes Relative: 26 % (ref 12–46)
MCH: 28.8 pg (ref 26.0–34.0)
MCHC: 34.4 g/dL (ref 30.0–36.0)
MCV: 83.8 fL (ref 78.0–100.0)
MONO ABS: 0.5 10*3/uL (ref 0.1–1.0)
Monocytes Relative: 6 % (ref 3–12)
NEUTROS ABS: 5.9 10*3/uL (ref 1.7–7.7)
Neutrophils Relative %: 65 % (ref 43–77)
Platelets: 293 10*3/uL (ref 150–400)
RBC: 4.62 MIL/uL (ref 3.87–5.11)
RDW: 13.5 % (ref 11.5–15.5)
WBC: 9.1 10*3/uL (ref 4.0–10.5)

## 2013-10-14 MED ORDER — ETONOGESTREL-ETHINYL ESTRADIOL 0.12-0.015 MG/24HR VA RING: VAGINAL_RING | VAGINAL | Status: DC

## 2013-10-14 NOTE — Patient Instructions (Signed)
Call to Schedule your mammogram  Facilities in [place]: 1)  [facility], [address] Phone: [phone] 2)  [facility]. [address] [place] Phone: [phone] 3)  Dr. Isaiah Blakes at [facility] [address] Phone: [phone]     Mammogram A mammogram is an X-ray test to find changes in a woman's breast. You should get a mammogram if:  You are 33 years of age or older  You have risk factors.   Your doctor recommends that you have one.  BEFORE THE TEST  Do not schedule the test the week before your period, especially if your breasts are sore during this time.  On the day of your mammogram:  Wash your breasts and armpits well. After washing, do not put on any deodorant or talcum powder on until after your test.   Eat and drink as you usually do.   Take your medicines as usual.   If you are diabetic and take insulin, make sure you:   Eat before coming for your test.   Take your insulin as usual.   If you cannot keep your appointment, call before the appointment to cancel. Schedule another appointment.  TEST  You will need to undress from the waist up. You will put on a hospital gown.   Your breast will be put on the mammogram machine, and it will press firmly on your breast with a piece of plastic called a compression paddle. This will make your breast flatter so that the machine can X-ray all parts of your breast.   Both breasts will be X-rayed. Each breast will be X-rayed from above and from the side. An X-ray might need to be taken again if the picture is not good enough.   The mammogram will last about 15 to 30 minutes.  AFTER THE TEST Finding out the results of your test Ask when your test results will be ready. Make sure you get your test results.  Document Released: 05/06/2008 Document Revised: 01/27/2011 Document Reviewed: 05/06/2008 [facility] Patient  Information 2012 [place].  You may obtain a copy of any labs that were done today by logging onto MyChart as outlined in the instructions provided with your AVS (after visit summary). The office will not call with normal lab results but certainly if there are any significant abnormalities then we will contact you.   Health Maintenance, Female A healthy lifestyle and preventative care can promote health and wellness.  Maintain regular health, dental, and eye exams.  Eat a healthy diet. Foods like vegetables, fruits, whole grains, low-fat dairy products, and lean protein foods contain the nutrients you need without too many calories. Decrease your intake of foods high in solid fats, added sugars, and salt. Get information about a proper diet from your caregiver, if necessary.  Regular physical exercise is one of the most important things you can do for your health. Most adults should get at least 150 minutes of moderate-intensity exercise (any activity that increases your heart rate and causes you to sweat) each week. In addition, most adults need muscle-strengthening exercises on 2 or more days a week.   Maintain a healthy weight. The body mass index (BMI) is a screening tool to identify possible weight problems. It provides an estimate of body fat based on height and weight. Your caregiver can help determine your BMI, and can help you achieve or maintain a healthy weight. For adults  20 years and older:  A BMI below 18.5 is considered underweight.  A BMI of 18.5 to 24.9 is normal.  A BMI of 25 to 29.9 is considered overweight.  A BMI of 30 and above is considered obese.  Maintain normal blood lipids and cholesterol by exercising and minimizing your intake of saturated fat. Eat a balanced diet with plenty of fruits and vegetables. Blood tests for lipids and cholesterol should begin at age 20 and be repeated every 5 years. If your lipid or cholesterol levels are high, you are over 50, or  you are a high risk for heart disease, you may need your cholesterol levels checked more frequently.Ongoing high lipid and cholesterol levels should be treated with medicines if diet and exercise are not effective.  If you smoke, find out from your caregiver how to quit. If you do not use tobacco, do not start.  Lung cancer screening is recommended for adults aged 55 80 years who are at high risk for developing lung cancer because of a history of smoking. Yearly low-dose computed tomography (CT) is recommended for people who have at least a 30-pack-year history of smoking and are a current smoker or have quit within the past 15 years. A pack year of smoking is smoking an average of 1 pack of cigarettes a day for 1 year (for example: 1 pack a day for 30 years or 2 packs a day for 15 years). Yearly screening should continue until the smoker has stopped smoking for at least 15 years. Yearly screening should also be stopped for people who develop a health problem that would prevent them from having lung cancer treatment.  If you are pregnant, do not drink alcohol. If you are breastfeeding, be very cautious about drinking alcohol. If you are not pregnant and choose to drink alcohol, do not exceed 1 drink per day. One drink is considered to be 12 ounces (355 mL) of beer, 5 ounces (148 mL) of wine, or 1.5 ounces (44 mL) of liquor.  Avoid use of street drugs. Do not share needles with anyone. Ask for help if you need support or instructions about stopping the use of drugs.  High blood pressure causes heart disease and increases the risk of stroke. Blood pressure should be checked at least every 1 to 2 years. Ongoing high blood pressure should be treated with medicines, if weight loss and exercise are not effective.  If you are 55 to 33 years old, ask your caregiver if you should take aspirin to prevent strokes.  Diabetes screening involves taking a blood sample to check your fasting blood sugar level. This  should be done once every 3 years, after age 45, if you are within normal weight and without risk factors for diabetes. Testing should be considered at a younger age or be carried out more frequently if you are overweight and have at least 1 risk factor for diabetes.  Breast cancer screening is essential preventative care for women. You should practice "breast self-awareness." This means understanding the normal appearance and feel of your breasts and may include breast self-examination. Any changes detected, no matter how small, should be reported to a caregiver. Women in their 20s and 30s should have a clinical breast exam (CBE) by a caregiver as part of a regular health exam every 1 to 3 years. After age 40, women should have a CBE every year. Starting at age 40, women should consider having a mammogram (breast X-ray) every year. Women who have a   family history of breast cancer should talk to their caregiver about genetic screening. Women at a high risk of breast cancer should talk to their caregiver about having an MRI and a mammogram every year.  Breast cancer gene (BRCA)-related cancer risk assessment is recommended for women who have family members with BRCA-related cancers. BRCA-related cancers include breast, ovarian, tubal, and peritoneal cancers. Having family members with these cancers may be associated with an increased risk for harmful changes (mutations) in the breast cancer genes BRCA1 and BRCA2. Results of the assessment will determine the need for genetic counseling and BRCA1 and BRCA2 testing.  The Pap test is a screening test for cervical cancer. Women should have a Pap test starting at age 33. Between ages 75 and 14, Pap tests should be repeated every 2 years. Beginning at age 4, you should have a Pap test every 3 years as long as the past 3 Pap tests have been normal. If you had a hysterectomy for a problem that was not cancer or a condition that could lead to cancer, then you no longer  need Pap tests. If you are between ages 72 and 12, and you have had normal Pap tests going back 10 years, you no longer need Pap tests. If you have had past treatment for cervical cancer or a condition that could lead to cancer, you need Pap tests and screening for cancer for at least 20 years after your treatment. If Pap tests have been discontinued, risk factors (such as a new sexual partner) need to be reassessed to determine if screening should be resumed. Some women have medical problems that increase the chance of getting cervical cancer. In these cases, your caregiver may recommend more frequent screening and Pap tests.  The human papillomavirus (HPV) test is an additional test that may be used for cervical cancer screening. The HPV test looks for the virus that can cause the cell changes on the cervix. The cells collected during the Pap test can be tested for HPV. The HPV test could be used to screen women aged 84 years and older, and should be used in women of any age who have unclear Pap test results. After the age of 73, women should have HPV testing at the same frequency as a Pap test.  Colorectal cancer can be detected and often prevented. Most routine colorectal cancer screening begins at the age of 56 and continues through age 76. However, your caregiver may recommend screening at an earlier age if you have risk factors for colon cancer. On a yearly basis, your caregiver may provide home test kits to check for hidden blood in the stool. Use of a small camera at the end of a tube, to directly examine the colon (sigmoidoscopy or colonoscopy), can detect the earliest forms of colorectal cancer. Talk to your caregiver about this at age 20, when routine screening begins. Direct examination of the colon should be repeated every 5 to 10 years through age 83, unless early forms of pre-cancerous polyps or small growths are found.  Hepatitis C blood testing is recommended for all people born from [birth year]  through 1965 and any individual with known risks for hepatitis C.  Practice safe sex. Use condoms and avoid high-risk sexual practices to reduce the spread of sexually transmitted infections (STIs). Sexually active women aged 28 and younger should be checked for Chlamydia, which is a common sexually transmitted infection. Older women with new or multiple partners should also be tested for Chlamydia. Testing for  other STIs is recommended if you are sexually active and at increased risk.  Osteoporosis is a disease in which the bones lose minerals and strength with aging. This can result in serious bone fractures. The risk of osteoporosis can be identified using a bone density scan. Women ages 35 and over and women at risk for fractures or osteoporosis should discuss screening with their caregivers. Ask your caregiver whether you should be taking a calcium supplement or vitamin D to reduce the rate of osteoporosis.  Menopause can be associated with physical symptoms and risks. Hormone replacement therapy is available to decrease symptoms and risks. You should talk to your caregiver about whether hormone replacement therapy is right for you.  Use sunscreen. Apply sunscreen liberally and repeatedly throughout the day. You should seek shade when your shadow is shorter than you. Protect yourself by wearing long sleeves, pants, a wide-brimmed hat, and sunglasses year round, whenever you are outdoors.  Notify your caregiver of new moles or changes in moles, especially if there is a change in shape or color. Also notify your caregiver if a mole is larger than the size of a pencil eraser.  Stay current with your immunizations. Document Released: 08/23/2010 Document Revised: 06/04/2012 Document Reviewed: 08/23/2010 [facility] Patient Information 2014 [place].

## 2013-10-14 NOTE — Progress Notes (Signed)
[name] 21-Mar-1980 [id number]        33 y.o.  [id number] for annual exam.  Several issues noted below.  Past medical history,surgical history, problem list, medications, allergies, family history and social history were all reviewed and documented as reviewed in the EPIC chart.  ROS:  12 system ROS performed with pertinent positives and negatives included in the history, assessment and plan.   Additional significant findings :  None   Exam: Kim Counsellor Vitals:   10/14/13 1355  BP: 120/78  Height: _0  (1.6 m)  Weight: 163 lb (73.936 kg)   General appearance:  Normal affect, orientation and appearance. Skin: Grossly normal HEENT: Without gross lesions.  No cervical or supraclavicular adenopathy. Thyroid normal.  Lungs:  Clear without wheezing, rales or rhonchi Cardiac: RR, without RMG Abdominal:  Soft, nontender, without masses, guarding, rebound, organomegaly or hernia Breasts:  Examined lying and sitting without masses, retractions, discharge or axillary adenopathy. Pelvic:  Ext/BUS/vagina normal  Cervix scarred, flush with the vagina. Pap done, cervical mucus noted  Uterus retroverted, normal size, shape and contour, midline and mobile nontender   Adnexa  Without masses or tenderness    Anus and perineum  Normal   Rectovaginal  Normal sphincter tone without palpated masses or tenderness.    Assessment/Plan:  33 y.o. [id number] female for annual exam with regular menses, NuvaRing contraception.   1. Contraceptive management. Patient using NuvaRing but wants to proceed with tubal sterilization. She's tired of using birth control particularly convinced that it is contributing to her weight gain. Had a bad experience with an IUD with prolonged bleeding. Has tried birth control pills and Depo-Provera. I reviewed all contraceptive options to include pill patch ring Depo-Provera nexplanon IUDs and sterilization to include vasectomy tubal and Essure. I reviewed the current  SGO recommendations for salpingectomies as risk reduction surgeries for ovarian cancer. Longer surgical time and no potential for reversibility requiring in vitro fertilization if patient changes her mine discussed. Patient states she clearly does not want to ever be pregnant again and wants to proceed with tubal sterilization and chooses salpingectomies if possible. She does understand if scarring or other anatomic distortion I may not be able to move both of her tubes. The expected intraoperative and postoperative courses as well as the recovery period was reviewed. Risks include infection, prolonged antibiotics, hemorrhage necessitating transfusion and the risks of transfusion including transfusion reaction, hepatitis, HIV, mad cow disease and other unknown entities was discussed. Incisional complications to include opening and draining of incisions, closure by secondary intention, long-term issues of cosmetics and keloid formation as well as hernia formation were reviewed. The risks of inadvertent injury to internal organs, either immediately recognized or delay recognized, including bowel, bladder, ureters, vessels, nerves necessitating major exploratory reparative surgeries and future reparative surgeries including ostomy formation, bowel resection, bladder repair, ureteral damage repair was all discussed with her. The absolute irreversible sterility associated with tubal sterilization was reviewed as well as the potential for failure and pregnancy in the future was discussed. The patient's questions were answered to her satisfaction and she is ready to schedule and proceed with surgery. It was stressed for her to continue on the NuvaRing until we proceed with surgery. Refill x4 months provided. 2.   Reported maternal history of breast cancer age 65/36. Patient was going to have BRCA testing last year and had the blood drawn but the insurance would not pay for it she ultimately decided against this. She  understands implications as  far as if genetically positive increased risk of breast cancer as well as ovarian cancer. At this point she declines genetic testing. She has no other family members with breast cancer or ovarian cancer. I did recommend baseline mammogram now with this history she's going to call and arrange. If she has any problems doing so she is to call my office. SBE monthly reviewed 3. Pap smear/HPV negative 2014 although there was an absence of endocervical cells we're done today noting cervical mucus. 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 Followup Pap smears have been negative since then. Will plan less frequent screening interval assuming this Pap smear is normal. 4. History of HSV. Never had a recurrence since her initial outbreak. Patient is to call if she has any outbreaks. 5. Health maintenance. Patient reportedly had blood work last year but I cannot find the results. CBC comprehensive metabolic panel lipid profile. Followup for tubal sterilization otherwise annually.   Note: This document was prepared with digital dictation and possible smart phrase technology. Any transcriptional errors that result from this process are unintentional.   Anastasio Auerbach MD, 2:30 PM 10/14/2013

## 2013-10-15 ENCOUNTER — Other Ambulatory Visit: Payer: Self-pay | Admitting: Gynecology

## 2013-10-15 ENCOUNTER — Telehealth: Payer: Self-pay | Admitting: *Deleted

## 2013-10-15 ENCOUNTER — Encounter: Payer: Self-pay | Admitting: *Deleted

## 2013-10-15 DIAGNOSIS — E7800 Pure hypercholesterolemia, unspecified: Secondary | ICD-10-CM

## 2013-10-15 LAB — LIPID PANEL
CHOL/HDL RATIO: 4.3 ratio
Cholesterol: 222 mg/dL — ABNORMAL HIGH (ref 0–200)
HDL: 52 mg/dL (ref >39)
LDL Cholesterol: 137 mg/dL — ABNORMAL HIGH (ref 0–99)
Triglycerides: 164 mg/dL — ABNORMAL HIGH (ref <150)
VLDL: 33 mg/dL (ref 0–40)

## 2013-10-15 LAB — COMPREHENSIVE METABOLIC PANEL
ALBUMIN: 4.4 g/dL (ref 3.5–5.2)
ALT: 10 U/L (ref 0–35)
AST: 13 U/L (ref 0–37)
Alkaline Phosphatase: 86 U/L (ref 39–117)
BUN: 13 mg/dL (ref 6–23)
CO2: 22 meq/L (ref 19–32)
CREATININE: 0.8 mg/dL (ref 0.50–1.10)
Calcium: 9.1 mg/dL (ref 8.4–10.5)
Chloride: 108 mEq/L (ref 96–112)
Glucose, Bld: 90 mg/dL (ref 70–99)
Potassium: 3.8 mEq/L (ref 3.5–5.3)
Sodium: 139 mEq/L (ref 135–145)
Total Bilirubin: 0.4 mg/dL (ref 0.2–1.2)
Total Protein: 7.5 g/dL (ref 6.0–8.3)

## 2013-10-15 LAB — URINALYSIS W MICROSCOPIC + REFLEX CULTURE
BILIRUBIN URINE: NEGATIVE
Bacteria, UA: NONE SEEN
CASTS: NONE SEEN
CRYSTALS: NONE SEEN
Glucose, UA: NEGATIVE mg/dL
Hgb urine dipstick: NEGATIVE
Ketones, ur: NEGATIVE mg/dL
LEUKOCYTES UA: NEGATIVE
Nitrite: NEGATIVE
PH: 6 (ref 5.0–8.0)
Protein, ur: NEGATIVE mg/dL
SQUAMOUS EPITHELIAL / LPF: NONE SEEN
Specific Gravity, Urine: 1.029 (ref 1.005–1.030)
Urobilinogen, UA: 1 mg/dL (ref 0.0–1.0)

## 2013-10-15 NOTE — Telephone Encounter (Signed)
Pt was seen in office for annual on 10/15/13 requesting note for absence. Note will be faxed to [phone] per pt request.

## 2013-10-16 ENCOUNTER — Telehealth: Payer: Self-pay

## 2013-10-16 LAB — CYTOLOGY - PAP

## 2013-10-16 NOTE — Telephone Encounter (Signed)
Surgery rescheduled to Lap Tubal at patient's request per Dr. Velvet Bathe.

## 2013-10-16 NOTE — Telephone Encounter (Signed)
I scheduled Bilateral Salpingectomy for sterilization and called patient regarding financial responsibility (deductible and co-insurance). Patient had already contacted ins co and was told tubal sterilization paid at 100% and that is why she was proceeding. I spoke with the insurance company. Tubal ligation is covered at 100% but Lap Salpingectomy for sterilization is not one of the procedures covered at 100%.   I spoke with patient and relayed this information to her.  She said she cannot afford to have Salpingectomy and would like to proceed with Lap Tubal Ligation as her insurance company confirmed that they cover 100% of the alowable.  She said Dr. Velvet Bathe offered this as an option and because of her financial situation at this time she wants lap tubal.  Dr. Velvet Bathe if this is okay with you will you send me new surgery slip or just advise regarding procedure. Thanks.

## 2013-10-16 NOTE — Telephone Encounter (Signed)
I will send the new surgical slip

## 2013-10-28 ENCOUNTER — Other Ambulatory Visit: Payer: Self-pay | Admitting: Gynecology

## 2013-11-04 ENCOUNTER — Other Ambulatory Visit: Payer: Self-pay

## 2013-11-04 ENCOUNTER — Telehealth: Payer: Self-pay

## 2013-11-04 ENCOUNTER — Other Ambulatory Visit: Payer: Self-pay | Admitting: Gynecology

## 2013-11-04 MED ORDER — IBUPROFEN 600 MG PO TABS: 600.0000 mg | ORAL_TABLET | Freq: Four times a day (QID) | ORAL | Status: DC | PRN

## 2013-11-04 NOTE — Telephone Encounter (Signed)
Sent to pharmacy 

## 2013-11-04 NOTE — H&P (Signed)
[name] 10-17-1980 [id number]   History and Physical  Chief complaint: Desires permanent sterilization  History of present illness: Leah y.o. [name] is admitted for laparoscopic tubal sterilization after counseling for all alternative methods as noted below.  Past medical history,surgical history, medications, allergies, family history and social history were all reviewed and documented in the EPIC chart.  ROS:  Was performed and pertinent positives and negatives are included in the history of present illness.  Exam:  [name] assistant based upon 10/14/2013 exam General appearance: Normal affect, orientation and appearance.  Skin: Grossly normal  HEENT: Without gross lesions. No cervical or supraclavicular adenopathy. Thyroid normal.  Lungs: Clear without wheezing, rales or rhonchi  Cardiac: RR, without RMG  Abdominal: Soft, nontender, without masses, guarding, rebound, organomegaly or hernia  Breasts: Examined lying and sitting without masses, retractions, discharge or axillary adenopathy.  Pelvic:  Ext/BUS/vagina normal  Cervix scarred, flush with the vagina. Pap done, cervical mucus noted  Uterus retroverted, normal size, shape and contour, midline and mobile nontender  Adnexa Without masses or tenderness  Anus and perineum Normal  Rectovaginal Normal sphincter tone without palpated masses or tenderness.    1. Assessment/Plan: Leah y.o. [id number] currently using NuvaRing but wants to proceed with tubal sterilization. She's tired of using birth control particularly convinced that it is contributing to her weight gain. Had a bad experience with an IUD with prolonged bleeding. Has tried birth control pills and Depo-Provera. I reviewed all contraceptive options to include pill, patch, ring Depo-Provera, nexplanon, IUDs and sterilization to include vasectomy, tubal and Essure. I reviewed the current SGO recommendations for salpingectomies as risk reduction surgeries for ovarian cancer.  Longer surgical time and no potential for reversibility requiring in vitro fertilization if patient changes her mind discussed. Patient states she clearly does not want to ever be pregnant again and wants to proceed with tubal sterilization but declines salpingectomies due to the the insurance company apparently not paying for this procedure but will reimburse for a standard tubal sterilization.  I reviewed with her that we will attempt a Falope ring procedure with backup cautery if unable to proceed with the Falope ring. I did review with her that there does appear to be a decreased risk of ovarian cancer with standard tubal sterilization but unclear whether this would be the same as salpingectomy. Regardless the patient declines salpingectomy.  The expected intraoperative and postoperative courses as well as the recovery period was reviewed. Risks include infection, prolonged antibiotics, hemorrhage necessitating transfusion and the risks of transfusion including transfusion reaction, hepatitis, HIV, mad cow disease and other unknown entities was discussed. Incisional complications to include opening and draining of incisions, closure by secondary intention, long-term issues of cosmetics and keloid formation as well as hernia formation were reviewed. The risks of inadvertent injury to internal organs, either immediately recognized or delay recognized, including bowel, bladder, ureters, vessels, nerves necessitating major exploratory reparative surgeries and future reparative surgeries including ostomy formation, bowel resection, bladder repair, ureteral damage repair was all discussed with her. The absolute irreversible sterility associated with tubal sterilization was reviewed as well as the potential for failure and pregnancy in the future was discussed. The patient's questions were answered to her satisfaction and she is ready to schedule and proceed with surgery. It was stressed for her to continue on the  NuvaRing until we proceed with surgery.   Note: This document was prepared with digital dictation and possible smart phrase technology. Any transcriptional errors that result from this process are  unintentional.  [name], 4:46 PM 11/04/2013

## 2013-11-04 NOTE — Telephone Encounter (Signed)
Patient is having lap tubal on Thursday and plans to return to work on Monday.  She said her employer is concerned about her coming back on Monday and their main concern is that she has to park a good distance away from the building and walk in.  I told her she should be fine for that.  Just double checking that you agree.

## 2013-11-04 NOTE — Telephone Encounter (Signed)
Per Dr. Velvet Bathe this should be fine.

## 2013-11-04 NOTE — Telephone Encounter (Signed)
I called the patient in follow up to review the proposed surgery which is going to be a laparoscopic Falope ring tubal sterilization with possible cautery as backup. We have discussed prophylactic salpingectomies but her insurance company will not pay for this and she will be billed separately. I reviewed this with the patient and she is not interested in being billed separately and does not want the salpingectomies but once the straightforward tubal sterilization. She understands that this also appears to decrease her risk of ovarian cancer but it is unclear whether it is less than if we did the salpingectomy. She understands and accepts this.

## 2013-11-04 NOTE — Addendum Note (Signed)
Addended by: Aura Camps on: 11/04/2013 02:24 PM   Modules accepted: Orders

## 2013-11-12 ENCOUNTER — Encounter (HOSPITAL_COMMUNITY): Payer: Self-pay

## 2013-11-20 MED ORDER — DEXTROSE 5 % IV SOLN
2.0000 g | INTRAVENOUS | Status: AC
  Administered 2013-11-21: 2 g via INTRAVENOUS
  Filled 2013-11-20: qty 2

## 2013-11-21 ENCOUNTER — Encounter (HOSPITAL_COMMUNITY): Payer: BC Managed Care – PPO | Admitting: Anesthesiology

## 2013-11-21 ENCOUNTER — Ambulatory Visit (HOSPITAL_COMMUNITY): Payer: BC Managed Care – PPO | Admitting: Anesthesiology

## 2013-11-21 ENCOUNTER — Ambulatory Visit (HOSPITAL_COMMUNITY)
Admission: RE | Admit: 2013-11-21 | Discharge: 2013-11-21 | Disposition: A | Discharge status: Home / Self Care | Payer: BC Managed Care – PPO | Source: Ambulatory Visit | Attending: Gynecology | Admitting: Gynecology

## 2013-11-21 ENCOUNTER — Encounter (HOSPITAL_COMMUNITY)
Admission: RE | Disposition: A | Discharge status: Home / Self Care | Payer: Self-pay | Source: Ambulatory Visit | Attending: Gynecology

## 2013-11-21 ENCOUNTER — Encounter (HOSPITAL_COMMUNITY): Payer: Self-pay | Admitting: Anesthesiology

## 2013-11-21 DIAGNOSIS — N809 Endometriosis, unspecified: Secondary | ICD-10-CM

## 2013-11-21 DIAGNOSIS — Z302 Encounter for sterilization: Secondary | ICD-10-CM | POA: Insufficient documentation

## 2013-11-21 DIAGNOSIS — N803 Endometriosis of pelvic peritoneum: Secondary | ICD-10-CM | POA: Insufficient documentation

## 2013-11-21 HISTORY — PX: LAPAROSCOPIC TUBAL LIGATION: SHX1937

## 2013-11-21 LAB — CBC
HCT: 40.1 % (ref 36.0–46.0)
HEMOGLOBIN: 14 g/dL (ref 12.0–15.0)
MCH: 29.7 pg (ref 26.0–34.0)
MCHC: 34.9 g/dL (ref 30.0–36.0)
MCV: 85 fL (ref 78.0–100.0)
Platelets: 294 10*3/uL (ref 150–400)
RBC: 4.72 MIL/uL (ref 3.87–5.11)
RDW: 12.8 % (ref 11.5–15.5)
WBC: 10.4 10*3/uL (ref 4.0–10.5)

## 2013-11-21 LAB — HCG, SERUM, QUALITATIVE: Preg, Serum: NEGATIVE

## 2013-11-21 SURGERY — LIGATION, FALLOPIAN TUBE, LAPAROSCOPIC
Anesthesia: General | Site: Abdomen

## 2013-11-21 MED ORDER — MIDAZOLAM HCL 2 MG/2ML IJ SOLN
INTRAMUSCULAR | Status: AC
  Filled 2013-11-21: qty 2

## 2013-11-21 MED ORDER — FENTANYL CITRATE 0.05 MG/ML IJ SOLN
25.0000 ug | INTRAMUSCULAR | Status: DC | PRN
  Administered 2013-11-21 (×2): 50 ug via INTRAVENOUS

## 2013-11-21 MED ORDER — SCOPOLAMINE 1 MG/3DAYS TD PT72
MEDICATED_PATCH | TRANSDERMAL | Status: AC
  Administered 2013-11-21: 1.5 mg via TRANSDERMAL
  Filled 2013-11-21: qty 1

## 2013-11-21 MED ORDER — DEXAMETHASONE SODIUM PHOSPHATE 10 MG/ML IJ SOLN
INTRAMUSCULAR | Status: DC | PRN
  Administered 2013-11-21: 4 mg via INTRAVENOUS

## 2013-11-21 MED ORDER — BUPIVACAINE HCL (PF) 0.25 % IJ SOLN
INTRAMUSCULAR | Status: DC | PRN
  Administered 2013-11-21: 10 mL

## 2013-11-21 MED ORDER — GLYCOPYRROLATE 0.2 MG/ML IJ SOLN
INTRAMUSCULAR | Status: DC | PRN
  Administered 2013-11-21: 0.1 mg via INTRAVENOUS
  Administered 2013-11-21: .8 mg via INTRAVENOUS

## 2013-11-21 MED ORDER — OXYCODONE-ACETAMINOPHEN 5-325 MG PO TABS
1.0000 | ORAL_TABLET | ORAL | Status: DC | PRN
  Administered 2013-11-21: 1 via ORAL

## 2013-11-21 MED ORDER — LACTATED RINGERS IV SOLN
INTRAVENOUS | Status: DC
  Administered 2013-11-21 (×2): via INTRAVENOUS

## 2013-11-21 MED ORDER — KETOROLAC TROMETHAMINE 30 MG/ML IJ SOLN
INTRAMUSCULAR | Status: DC | PRN
  Administered 2013-11-21: 30 mg via INTRAVENOUS

## 2013-11-21 MED ORDER — ONDANSETRON HCL 4 MG/2ML IJ SOLN
INTRAMUSCULAR | Status: AC
  Filled 2013-11-21: qty 2

## 2013-11-21 MED ORDER — DEXAMETHASONE SODIUM PHOSPHATE 10 MG/ML IJ SOLN
INTRAMUSCULAR | Status: AC
  Filled 2013-11-21: qty 1

## 2013-11-21 MED ORDER — ACETAMINOPHEN 160 MG/5ML PO SOLN
950.0000 mg | Freq: Once | ORAL | Status: AC
  Administered 2013-11-21: 950 mg via ORAL

## 2013-11-21 MED ORDER — FENTANYL CITRATE 0.05 MG/ML IJ SOLN
INTRAMUSCULAR | Status: AC
  Administered 2013-11-21: 50 ug via INTRAVENOUS
  Filled 2013-11-21: qty 2

## 2013-11-21 MED ORDER — FENTANYL CITRATE 0.05 MG/ML IJ SOLN
INTRAMUSCULAR | Status: AC
  Filled 2013-11-21: qty 2

## 2013-11-21 MED ORDER — PROPOFOL 10 MG/ML IV BOLUS
INTRAVENOUS | Status: DC | PRN
  Administered 2013-11-21: 50 mg via INTRAVENOUS
  Administered 2013-11-21: 150 mg via INTRAVENOUS

## 2013-11-21 MED ORDER — ONDANSETRON HCL 4 MG/2ML IJ SOLN
INTRAMUSCULAR | Status: DC | PRN
  Administered 2013-11-21: 4 mg via INTRAVENOUS

## 2013-11-21 MED ORDER — KETOROLAC TROMETHAMINE 30 MG/ML IJ SOLN
INTRAMUSCULAR | Status: AC
  Filled 2013-11-21: qty 1

## 2013-11-21 MED ORDER — SCOPOLAMINE 1 MG/3DAYS TD PT72
1.0000 | MEDICATED_PATCH | Freq: Once | TRANSDERMAL | Status: DC
  Administered 2013-11-21: 1.5 mg via TRANSDERMAL

## 2013-11-21 MED ORDER — ROCURONIUM BROMIDE 100 MG/10ML IV SOLN
INTRAVENOUS | Status: AC
  Filled 2013-11-21: qty 1

## 2013-11-21 MED ORDER — KETOROLAC TROMETHAMINE 30 MG/ML IJ SOLN: 15.0000 mg | Freq: Once | INTRAMUSCULAR | Status: DC | PRN

## 2013-11-21 MED ORDER — FENTANYL CITRATE 0.05 MG/ML IJ SOLN
INTRAMUSCULAR | Status: DC | PRN
  Administered 2013-11-21 (×7): 50 ug via INTRAVENOUS

## 2013-11-21 MED ORDER — LIDOCAINE HCL (CARDIAC) 20 MG/ML IV SOLN
INTRAVENOUS | Status: DC | PRN
  Administered 2013-11-21: 80 mg via INTRAVENOUS

## 2013-11-21 MED ORDER — BUPIVACAINE HCL (PF) 0.25 % IJ SOLN
INTRAMUSCULAR | Status: AC
  Filled 2013-11-21: qty 30

## 2013-11-21 MED ORDER — LIDOCAINE HCL (CARDIAC) 20 MG/ML IV SOLN
INTRAVENOUS | Status: AC
  Filled 2013-11-21: qty 5

## 2013-11-21 MED ORDER — NEOSTIGMINE METHYLSULFATE 10 MG/10ML IV SOLN
INTRAVENOUS | Status: AC
  Filled 2013-11-21: qty 1

## 2013-11-21 MED ORDER — GLYCOPYRROLATE 0.2 MG/ML IJ SOLN
INTRAMUSCULAR | Status: AC
  Filled 2013-11-21: qty 4

## 2013-11-21 MED ORDER — FENTANYL CITRATE 0.05 MG/ML IJ SOLN
INTRAMUSCULAR | Status: AC
  Filled 2013-11-21: qty 5

## 2013-11-21 MED ORDER — MEPERIDINE HCL 25 MG/ML IJ SOLN
6.2500 mg | INTRAMUSCULAR | Status: DC | PRN
  Administered 2013-11-21: 12.5 mg via INTRAVENOUS

## 2013-11-21 MED ORDER — OXYCODONE-ACETAMINOPHEN 5-325 MG PO TABS: 1.0000 | ORAL_TABLET | ORAL | Status: DC | PRN

## 2013-11-21 MED ORDER — NEOSTIGMINE METHYLSULFATE 10 MG/10ML IV SOLN
INTRAVENOUS | Status: DC | PRN
  Administered 2013-11-21: 5 mg via INTRAVENOUS

## 2013-11-21 MED ORDER — PROPOFOL 10 MG/ML IV EMUL
INTRAVENOUS | Status: AC
  Filled 2013-11-21: qty 20

## 2013-11-21 MED ORDER — GLYCOPYRROLATE 0.2 MG/ML IJ SOLN
INTRAMUSCULAR | Status: AC
  Filled 2013-11-21: qty 1

## 2013-11-21 MED ORDER — OXYCODONE-ACETAMINOPHEN 5-325 MG PO TABS
ORAL_TABLET | ORAL | Status: AC
  Administered 2013-11-21: 1 via ORAL
  Filled 2013-11-21: qty 1

## 2013-11-21 MED ORDER — MEPERIDINE HCL 25 MG/ML IJ SOLN
INTRAMUSCULAR | Status: AC
  Administered 2013-11-21: 12.5 mg via INTRAVENOUS
  Filled 2013-11-21: qty 1

## 2013-11-21 MED ORDER — ACETAMINOPHEN 160 MG/5ML PO SOLN
ORAL | Status: AC
  Administered 2013-11-21: 950 mg via ORAL
  Filled 2013-11-21: qty 40.6

## 2013-11-21 MED ORDER — ROCURONIUM BROMIDE 100 MG/10ML IV SOLN
INTRAVENOUS | Status: DC | PRN
  Administered 2013-11-21: 30 mg via INTRAVENOUS

## 2013-11-21 MED ORDER — PROMETHAZINE HCL 25 MG/ML IJ SOLN: 6.2500 mg | INTRAMUSCULAR | Status: DC | PRN

## 2013-11-21 SURGICAL SUPPLY — 17 items
ADH SKN CLS APL DERMABOND .7 (GAUZE/BANDAGES/DRESSINGS) ×1
BLADE 15 SAFETY STRL DISP (BLADE) ×2 IMPLANT
CATH ROBINSON RED A/P 16FR (CATHETERS) ×2 IMPLANT
CLOTH BEACON ORANGE TIMEOUT ST (SAFETY) ×2 IMPLANT
DERMABOND ADVANCED (GAUZE/BANDAGES/DRESSINGS) ×1
DERMABOND ADVANCED .7 DNX12 (GAUZE/BANDAGES/DRESSINGS) IMPLANT
DRSG COVADERM PLUS 2X2 (GAUZE/BANDAGES/DRESSINGS) ×3 IMPLANT
DRSG OPSITE POSTOP 3X4 (GAUZE/BANDAGES/DRESSINGS) ×1 IMPLANT
GLOVE BIO SURGEON STRL SZ7.5 (GLOVE) ×2 IMPLANT
GOWN STRL REUS W/TWL LRG LVL3 (GOWN DISPOSABLE) ×4 IMPLANT
PACK LAPAROSCOPY BASIN (CUSTOM PROCEDURE TRAY) ×2 IMPLANT
RING FALLOPIAN BANDS (Ring) ×1 IMPLANT
SUT PLAIN 3 0 X 1 18 (SUTURE) ×2 IMPLANT
SUT VICRYL 0 UR6 27IN ABS (SUTURE) ×2 IMPLANT
TOWEL OR 17X24 6PK STRL BLUE (TOWEL DISPOSABLE) ×4 IMPLANT
TROCAR XCEL NON-BLD 11X100MML (ENDOMECHANICALS) ×2 IMPLANT
WATER STERILE IRR 1000ML POUR (IV SOLUTION) ×2 IMPLANT

## 2013-11-21 NOTE — Anesthesia Procedure Notes (Signed)
Procedure Name: Intubation Date/Time: 11/21/2013 1:12 PM Performed by: Graciela HusbandsFUSSELL, Garren Greenman O Pre-anesthesia Checklist: Patient identified, Timeout performed, Emergency Drugs available, Suction available and Patient being monitored Patient Re-evaluated:Patient Re-evaluated prior to inductionOxygen Delivery Method: Circle system utilized Preoxygenation: Pre-oxygenation with 100% oxygen Intubation Type: IV induction Ventilation: Mask ventilation without difficulty Laryngoscope Size: Mac and 3 Grade View: Grade I Tube type: Oral Tube size: 7.0 mm Number of attempts: 1 Airway Equipment and Method: Stylet Placement Confirmation: ETT inserted through vocal cords under direct vision,  breath sounds checked- equal and bilateral and positive ETCO2 Secured at: 21 cm Tube secured with: Tape Dental Injury: Teeth and Oropharynx as per pre-operative assessment

## 2013-11-21 NOTE — Anesthesia Preprocedure Evaluation (Signed)
Anesthesia Evaluation  Patient identified by MRN, date of birth, ID band Patient awake    Reviewed: Allergy & Precautions, H&P , NPO status , Patient's Chart, lab work & pertinent test results, reviewed documented beta blocker date and time   History of Anesthesia Complications Negative for: history of anesthetic complications  Airway Mallampati: II TM Distance: >3 FB Neck ROM: full    Dental  (+) Teeth Intact   Pulmonary neg pulmonary ROS,  breath sounds clear to auscultation  Pulmonary exam normal       Cardiovascular Exercise Tolerance: Good negative cardio ROS  Rhythm:regular Rate:Normal     Neuro/Psych negative neurological ROS  negative psych ROS   GI/Hepatic negative GI ROS, Neg liver ROS,   Endo/Other  negative endocrine ROS  Renal/GU negative Renal ROS  negative genitourinary   Musculoskeletal   Abdominal   Peds  Hematology negative hematology ROS (+)   Anesthesia Other Findings   Reproductive/Obstetrics negative OB ROS                           Anesthesia Physical Anesthesia Plan  ASA: I  Anesthesia Plan: General ETT   Post-op Pain Management:    Induction:   Airway Management Planned:   Additional Equipment:   Intra-op Plan:   Post-operative Plan:   Informed Consent: I have reviewed the patients History and Physical, chart, labs and discussed the procedure including the risks, benefits and alternatives for the proposed anesthesia with the patient or authorized representative who has indicated his/her understanding and acceptance.   Dental Advisory Given  Plan Discussed with: CRNA and Surgeon  Anesthesia Plan Comments:         Anesthesia Quick Evaluation

## 2013-11-21 NOTE — H&P (Signed)
  The patient was examined.  I reviewed the proposed surgery and consent form with the patient.  The dictated history and physical is current and accurate and all questions were answered. The patient is ready to proceed with surgery and has a realistic understanding and expectation for the outcome.   Dara LordsFONTAINE,TIMOTHY P MD, 12:25 PM 11/21/2013

## 2013-11-21 NOTE — Discharge Instructions (Signed)
Postoperative Instructions Laparoscopy  Dr. Audie BoxFontaine and the nursing staff have discussed postoperative instructions with you.  If you have any questions please ask them before you leave the hospital, or call Dr Marva PandaFontaines office at [phone].    We would like to emphasize the following instructions:     Call the office to make your follow-up appointment as recommended by Dr Audie BoxFontaine (usually 1-2 weeks).    You were given a prescription, or one was ordered for you at the pharmacy you designated.  Get that prescription filled and take the medication according to instructions.    You may eat a regular diet, but slowly until you start having bowel movements.    Drink plenty of water daily.    Nothing in the vagina (intercourse, douching, objects of any kind) for 2 weeks.  When reinitiating intercourse, if it is uncomfortable, stop and make an appointment with Dr Audie BoxFontaine to be evaluated.    No driving for several days until the anesthesia has worn off and you are not having significant pain.  Car rides (short) are ok, as long as you are not having significant pain, but no traveling out of town until your postoperative appointment.    You may shower, but no baths for two weeks.  Walking up and down stairs is ok.  No heavy lifting, prolonged standing, repeated bending or any working out until your first  postoperative appointment.    Rest frequently, listen to your body and do not push yourself and overdo it.    Call if:  o Your pain medication does not seem strong enough. o Worsening pain or abdominal bloating o Persistent nausea or vomiting o Difficulty with urination or bowel movements. o Temperature of 101 degrees or higher. o Heavy vaginal bleeding.  If your period is due, you may use tampons.   o Incisions become red, tender or begin to drain. You have any questions or concernsDISCHARGE INSTRUCTIONS: Laparoscopy  The following instructions have been prepared to help you care  for yourself upon your return home today.  Wound care:  Do not get the incision wet for the first 24 hours. The incision should be kept clean and dry.  The Band-Aids or dressings may be removed the day after surgery.  Should the incision become sore, red, and swollen after the first week, check with your doctor.  Personal hygiene:  Shower the day after your procedure.  Activity and limitations:  Do NOT drive or operate any equipment today.  Do NOT lift anything more than 15 pounds for 2-3 weeks after surgery.  Do NOT rest in bed all day.  Walking is encouraged. Walk each day, starting slowly with 5-minute walks 3 or 4 times a day. Slowly increase the length of your walks.  Walk up and down stairs slowly.  Do NOT do strenuous activities, such as golfing, playing tennis, bowling, running, biking, weight lifting, gardening, mowing, or vacuuming for 2-4 weeks. Ask your doctor when it is okay to start.  Diet: Eat a light meal as desired this evening. You may resume your usual diet tomorrow.  Return to work: This is dependent on the type of work you do. For the most part you can return to a desk job within a week of surgery. If you are more active at work, please discuss this with your doctor.  What to expect after your surgery: You may have a slight burning sensation when you urinate on the first day. You may have a very small  a small amount of vaginal discharge/light bleeding for 1-2 weeks. It is not unusual to have abdominal soreness and bruising for up to 2 weeks. You may be tired and need more rest for about 1 week. You may experience shoulder pain for 24-72 hours. Lying flat in bed may relieve it. ° °Call your doctor for any of the following: °• Develop a fever of 100.4 or greater °• Inability to urinate 6 hours after discharge from hospital °• Severe pain not relieved by pain medications °• Persistent of heavy bleeding at incision site °•  Redness or swelling around incision site after a week °• Increasing nausea or vomiting ° °Patient Signature________________________________________ °o Nurse Signature_________________________________________ °

## 2013-11-21 NOTE — Op Note (Signed)
[name] 03/04/1980 [id number]   Post Operative Note   Date of surgery:  11/21/2013  Pre Op Dx:  Desires permanent sterilization  Post Op Dx:  Desires permanent sterilization, endometriosis  Procedure:  Laparoscopic bilateral tubal sterilization, Falope ring technique. Fulguration cul-de-sac endometriosis  Surgeon:  Colin BroachFONTAINE,Inman Fettig P  Anesthesia:  General  EBL:  minimal  Complications:  None  Specimen:  none   Findings: EUA:  External BUS vagina normal. Cervix slightly scarred. Uterus retroverted normal size midline mobile. Adnexa without masses   Operative:  Anterior cul-de-sac normal. Posterior cul-de-sac with 2 small classic superficial endometriotic implants mid cul-de-sac level of uterosacral ligament attachment to the uterus, bipolar fulgurated. Uterus normal size shape contour. Right and left fallopian tubes normal length caliber and fimbriated ends. Single Falope ring applied to the mid tubal segment bilaterally. Right and left ovaries normal without evidence of endometriosis or adhesions. Upper abdominal exam normal noting normal appendix, liver smooth without abnormalities, gallbladder grossly normal.  Procedure:  The patient was taken to the operating room, underwent general anesthesia, was placed in the low dorsal lithotomy position, received an abdominal and vaginal/perineal preparation with Betadine solution, EUA performed and the bladder emptied with an in and out Foley catheterization.  The timeout was performed by the surgical team. The cervix was visualized with a speculum, anterior lip grasped with a single-tooth tenaculum and a Hulka tenaculum was placed without difficulty.  The patient was draped in the usual fashion and a transverse infraumbilical incision was made and the abdomen was directly entered using a 10 mm direct entry trocar without difficulty. The abdomen was insufflated and examination of the pelvis and upper abdominal exam was carried out with  findings noted above. A midline suprapubic incision was then made after transillumination for the vessels and the Falope ring applyimg trocar was placed under direct visualization without difficulty. The right fallopian tube was then traced from its insertion to its fimbriated end and a mid tubal segment was drawn into the Falope ring applier and a single Falope ring was applied without difficulty noting a good knuckle of tube within the ring and appropriate blanching afterwards.  A similar procedure was carried out on the other side. Using the bipolar cautery the 2 small superficial posterior cul-de-sac endometriotic implants were fulgurated without difficulty. The suprapubic port was then removed under direct visualization showing adequate hemostasis and the infraumbilical port was then backed out under direct visualization showing adequate hemostasis and no evidence of hernia formation.  A 0 Vicryl interrupted subcutaneous/fascial stitch was placed infraumbilically and both skin incisions were injected with 0.25% Marcaine. The skin incisions were then closed using Dermabond skin adhesive, the patient received intraoperative Toradol, the Hulka tenaculum removed from the cervix and the patient awakened without difficulty and taken to recovery room in good condition having tolerated the procedure well.     Dara LordsFONTAINE,[name] MD, 2:04 PM 11/21/2013

## 2013-11-21 NOTE — Transfer of Care (Signed)
Immediate Anesthesia Transfer of Care Note  Patient: [name]  Procedure(s) Performed: Procedure(s): LAPAROSCOPIC TUBAL LIGATION WITH FALLOP RINGS AND FULGERATION OF ENDOMETRIOSIS (N/A)  Patient Location: PACU  Anesthesia Type:General  Level of Consciousness: awake, alert  and oriented  Airway & Oxygen Therapy: Patient Spontanous Breathing and Patient connected to nasal cannula oxygen  Post-op Assessment: Report given to PACU RN and Post -op Vital signs reviewed and stable  Post vital signs: Reviewed and stable  Complications: No apparent anesthesia complications

## 2013-11-22 ENCOUNTER — Encounter (HOSPITAL_COMMUNITY): Payer: Self-pay | Admitting: Gynecology

## 2013-11-25 ENCOUNTER — Telehealth: Payer: Self-pay

## 2013-11-25 MED ORDER — MEGESTROL ACETATE 20 MG PO TABS: ORAL_TABLET | ORAL | Status: DC

## 2013-11-25 NOTE — Telephone Encounter (Signed)
Okay for note. If there is any question she is welcome to come in today to be seen. Otherwise if she is not feeling better by tomorrow then she definitely needs to come in.

## 2013-11-25 NOTE — Telephone Encounter (Signed)
Patient informed. Rx sent.  Patient has gone home from work. She asked if you would give her a note to excuse her absence from work today and tomorrow. I asked her why tomorrow and she said she is feeling a little light headed and by Weds she will have been on the Megace two days and ought to be slowing down bleeding. Ok?

## 2013-11-25 NOTE — Telephone Encounter (Signed)
Had Lap Tubal 10/1. Today c/o heavy bleeding. Saturating a pad every hour. Some small clots and "gushes".  Cramping. No apparent fever. Did not anticipate menses until 12/05/13.   Patient is at work but waiting to hear if she needs office visit as she works close by.  Will be leaving work to go home.

## 2013-11-25 NOTE — Telephone Encounter (Signed)
I called patient back and advised her If there is any question she is welcome to come in today to be seen. Otherwise if she is not feeling better by tomorrow then she definitely needs to come in.  She will see how it goes and call in the morning if not feeling better.  I will ready note for her and she will call me when she returns to work and I will fax it to her OR if she comes in tomorrow for a visit she will pick it up then.

## 2013-11-25 NOTE — Telephone Encounter (Signed)
I would recommend Megace 20 mg 3 times a day times 1-2 days assuming bleeding slows then once daily for a week. #15. If bleeding continues office visit.

## 2013-12-13 ENCOUNTER — Encounter: Payer: Self-pay | Admitting: Gynecology

## 2013-12-13 ENCOUNTER — Ambulatory Visit (INDEPENDENT_AMBULATORY_CARE_PROVIDER_SITE_OTHER): Payer: BC Managed Care – PPO | Admitting: Gynecology

## 2013-12-13 DIAGNOSIS — Z09 Encounter for follow-up examination after completed treatment for conditions other than malignant neoplasm: Secondary | ICD-10-CM

## 2013-12-13 NOTE — Progress Notes (Signed)
[name] 08/03/1980 [id number]        33 y.o.  [id number] Presents for her postoperative visit status post laparoscopic bilateral tubal sterilization with Falope-Rings and cautery of endometriosis. Patient doing well without complaints  Past medical history,surgical history, problem list, medications, allergies, family history and social history were all reviewed and documented in the EPIC chart.  Directed ROS with pertinent positives and negatives documented in the history of present illness/assessment and plan.  Exam: [name] General appearance:  Normal Abdomen soft nontender without masses guarding rebound. Incisions healed nicely. Pelvic external BUS vagina normal. Cervix normal. Uterus normal size midline mobile nontender. Adnexa without masses or tenderness.  Assessment/Plan:  33 y.o. [id number] with normal postoperative visit status post laparoscopic sterilization with Falope-Rings and RE of endometriosis. I reviewed findings of 2 small classic appearing endometriotic implants noted posterior cul-de-sac that were fulgurated. I discussed what endometriosis is and symptoms to be aware of. Patient will report if she has worsening dysmenorrhea or other issues. Otherwise she will follow and she is due for her annual exam in August     [name] P MD, 9:44 AM 12/13/2013

## 2013-12-13 NOTE — Patient Instructions (Signed)
Follow up for annual exam August 2016. Sooner if any issues.

## 2013-12-23 ENCOUNTER — Encounter: Payer: Self-pay | Admitting: Gynecology

## 2013-12-23 NOTE — Anesthesia Postprocedure Evaluation (Signed)
  Anesthesia Post-op Note  Patient: [name]  Procedure(s) Performed: Procedure(s): LAPAROSCOPIC TUBAL LIGATION WITH FALLOP RINGS AND FULGERATION OF ENDOMETRIOSIS (N/A) Patient is awake and responsive. Pain and nausea are reasonably well controlled. Vital signs are stable and clinically acceptable. Oxygen saturation is clinically acceptable. There are no apparent anesthetic complications at this time. Patient is ready for discharge.

## 2014-01-27 ENCOUNTER — Encounter: Payer: Self-pay | Admitting: Gynecology

## 2014-01-27 ENCOUNTER — Telehealth: Payer: Self-pay | Admitting: *Deleted

## 2014-01-27 ENCOUNTER — Ambulatory Visit (INDEPENDENT_AMBULATORY_CARE_PROVIDER_SITE_OTHER): Payer: BC Managed Care – PPO | Admitting: Gynecology

## 2014-01-27 ENCOUNTER — Ambulatory Visit: Payer: BC Managed Care – PPO | Admitting: Gynecology

## 2014-01-27 DIAGNOSIS — R102 Pelvic and perineal pain: Secondary | ICD-10-CM

## 2014-01-27 LAB — URINALYSIS W MICROSCOPIC + REFLEX CULTURE
Bilirubin Urine: NEGATIVE
Casts: NONE SEEN
Crystals: NONE SEEN
Glucose, UA: NEGATIVE mg/dL
Ketones, ur: NEGATIVE mg/dL
Leukocytes, UA: NEGATIVE
Nitrite: NEGATIVE
PH: 6 (ref 5.0–8.0)
Protein, ur: NEGATIVE mg/dL
SPECIFIC GRAVITY, URINE: 1.025 (ref 1.005–1.030)
Urobilinogen, UA: 0.2 mg/dL (ref 0.0–1.0)
WBC UA: NONE SEEN WBC/hpf (ref <3)

## 2014-01-27 MED ORDER — OXYCODONE-ACETAMINOPHEN 2.5-325 MG PO TABS: 1.0000 | ORAL_TABLET | ORAL | Status: DC | PRN

## 2014-01-27 NOTE — Telephone Encounter (Signed)
error 

## 2014-01-27 NOTE — Patient Instructions (Signed)
Follow up for ultrasound as scheduled 

## 2014-01-27 NOTE — Progress Notes (Signed)
Vernelle EmeraldSasha T Basilio 10/06/1980 [id number]        33 y.o.  [id number] awoke this morning from sleep with acute left lower back/flank pain with radiation down her left leg and into her left pelvis. LMP 01/24/2014. Had been on NuvaRing but discontinued as is status post laparoscopic tubal sterilization 11/2013. No history of same before. No nausea or vomiting, diarrhea or constipation, urinary symptoms such as frequency dysuria or urgency. No fever or chills.  Past medical history,surgical history, problem list, medications, allergies, family history and social history were all reviewed and documented in the EPIC chart.  Directed ROS with pertinent positives and negatives documented in the history of present illness/assessment and plan.  Exam: Kim assistant General appearance:  Normal Spine straight without CVA tenderness Abdomen soft nontender without masses guarding rebound Pelvic external BUS vagina with light menses flow. Cervix grossly normal. Uterus retroverted midline mobile nontender. Adnexa without gross masses or tenderness. Rectal exam is normal.  Assessment/Plan:  33 y.o. [id number] with acute onset of left lower back/flank/pelvic pain since this morning. Seems better but still present. No associated symptoms. Urine analysis shows several red cells noting she is on her menses but otherwise unremarkable. Will follow up with culture. Start with ultrasound rule out ovarian process. If continues then we will refer to orthopedics as it does have a low back component. No GI or GU associated symptoms. Percocet 2.5/325 #20 one to 2 by mouth every 4-6 hours when necessary pain provided with no refill. Had used ibuprofen 600 mg without relief.     Dara LordsFONTAINE,TIMOTHY P MD, 11:36 AM 01/27/2014

## 2014-01-28 LAB — URINE CULTURE: Colony Count: 15000

## 2014-01-29 ENCOUNTER — Other Ambulatory Visit: Payer: Self-pay | Admitting: Gynecology

## 2014-01-29 ENCOUNTER — Ambulatory Visit (INDEPENDENT_AMBULATORY_CARE_PROVIDER_SITE_OTHER): Payer: BC Managed Care – PPO

## 2014-01-29 ENCOUNTER — Encounter: Payer: Self-pay | Admitting: Gynecology

## 2014-01-29 ENCOUNTER — Ambulatory Visit (INDEPENDENT_AMBULATORY_CARE_PROVIDER_SITE_OTHER): Payer: BC Managed Care – PPO | Admitting: Gynecology

## 2014-01-29 DIAGNOSIS — R102 Pelvic and perineal pain: Secondary | ICD-10-CM

## 2014-01-29 DIAGNOSIS — N8300 Follicular cyst of ovary, unspecified side: Secondary | ICD-10-CM

## 2014-01-29 DIAGNOSIS — R188 Other ascites: Secondary | ICD-10-CM

## 2014-01-29 NOTE — Patient Instructions (Signed)
Call if your pain continues. 

## 2014-01-29 NOTE — Progress Notes (Signed)
Vernelle EmeraldSasha T Christiano 04/15/1980 [id number]        33 y.o.  [id number] presents for ultrasound as ordered 01/27/2014 note.  History of acute left sided low back to pelvic pain 01/27/2014. Pain seems to be getting better. Now seems more localized in the pelvis.  Past medical history,surgical history, problem list, medications, allergies, family history and social history were all reviewed and documented in the EPIC chart.  Directed ROS with pertinent positives and negatives documented in the history of present illness/assessment and plan.  Ultrasound shows uterus normal size with endometrial echo 1.9 mm. Right left ovaries are normal with normal-appearing follicles. Small amount of free fluid in the cul-de-sac 30 x 26 x 9 mm.  Assessment/Plan:  33 y.o. [name] with left-sided lower back/pelvic pain. Ultrasounds negative. Little bit of free fluid suggestive of ruptured cyst. Pain is getting better. Patient will monitor and assuming pain totally resolves will follow. If pain persists or recurs will call.  Otherwise assuming it resolves then she'll follow up routinely when due for her annual exam in August 2016.     Dara LordsFONTAINE,Sarenity Ramaker P MD, 11:10 AM 01/29/2014

## 2014-03-04 ENCOUNTER — Telehealth: Payer: Self-pay

## 2014-03-04 NOTE — Telephone Encounter (Signed)
Left detailed message on her voice mail that Dr. Velvet BatheF recommends office visit. Please call to schedule and ph # provided.

## 2014-03-04 NOTE — Telephone Encounter (Signed)
Patient called stating she would like to start a "low dose birth control pill".  She said she had tubal ligation in Oct and her periods have been unusual. She said she had two periods in Dec and each was heavy, lasting 8 days.    I assume you would want office visit with her to discuss?

## 2014-03-04 NOTE — Telephone Encounter (Signed)
Correct, patient will need office visit with exam

## 2014-03-16 ENCOUNTER — Other Ambulatory Visit: Payer: Self-pay | Admitting: Gynecology

## 2014-03-25 ENCOUNTER — Institutional Professional Consult (permissible substitution): Payer: Self-pay | Admitting: Gynecology

## 2014-04-09 ENCOUNTER — Ambulatory Visit (INDEPENDENT_AMBULATORY_CARE_PROVIDER_SITE_OTHER): Payer: BLUE CROSS/BLUE SHIELD | Admitting: Gynecology

## 2014-04-09 ENCOUNTER — Encounter: Payer: Self-pay | Admitting: Gynecology

## 2014-04-09 DIAGNOSIS — N921 Excessive and frequent menstruation with irregular cycle: Secondary | ICD-10-CM

## 2014-04-09 LAB — CBC WITH DIFFERENTIAL/PLATELET
BASOS PCT: 0 % (ref 0–1)
Basophils Absolute: 0 10*3/uL (ref 0.0–0.1)
EOS ABS: 0.2 10*3/uL (ref 0.0–0.7)
Eosinophils Relative: 3 % (ref 0–5)
HCT: 39.1 % (ref 36.0–46.0)
Hemoglobin: 13 g/dL (ref 12.0–15.0)
Lymphocytes Relative: 27 % (ref 12–46)
Lymphs Abs: 1.8 10*3/uL (ref 0.7–4.0)
MCH: 28.5 pg (ref 26.0–34.0)
MCHC: 33.2 g/dL (ref 30.0–36.0)
MCV: 85.7 fL (ref 78.0–100.0)
MPV: 10.3 fL (ref 8.6–12.4)
Monocytes Absolute: 0.3 10*3/uL (ref 0.1–1.0)
Monocytes Relative: 5 % (ref 3–12)
NEUTROS PCT: 65 % (ref 43–77)
Neutro Abs: 4.2 10*3/uL (ref 1.7–7.7)
Platelets: 315 10*3/uL (ref 150–400)
RBC: 4.56 MIL/uL (ref 3.87–5.11)
RDW: 13.1 % (ref 11.5–15.5)
WBC: 6.5 10*3/uL (ref 4.0–10.5)

## 2014-04-09 LAB — TSH: TSH: 1.203 u[IU]/mL (ref 0.350–4.500)

## 2014-04-09 NOTE — Progress Notes (Signed)
[name] 06/01/1980 [id number]        34 y.o.  [id number] Presents complaining of heavy irregular menses. Had been on NuvaRing previously with regular menses. Had tubal sterilization 11/2013. Menses in November were heavy and she has had seven-day flows 2 weeks to 4 weeks intervals. No spotting or bleeding in between. Some cramping along with her menses. Notes having been on some sort of hormonal suppression since adolescence to regulate her menses.  No hair changes skin changes weight changes nipple discharge.  Past medical history,surgical history, problem list, medications, allergies, family history and social history were all reviewed and documented in the EPIC chart.  Directed ROS with pertinent positives and negatives documented in the history of present illness/assessment and plan.  Exam: Kim assistant General appearance:  Normal Abdomen soft nontender without masses guarding rebound. Pelvic external BUS vagina with moderate menses flow. Cervix normal. Uterus retroverted grossly normal size midline mobile nontender. Adnexa without masses or tenderness.  Assessment/Plan:  34 y.o. [name] with heavy irregular menses since stopping NuvaRing and having her tubal sterilization. Will check baseline CBC TSH and sonohysterogram to rule out intracavitary abnormalities. Options for management reviewed to include hormonal manipulation, Mirena IUD, endometrial ablation and hysterectomy. The pros/cons of each choice reviewed. Literature for endometrial ablation given to the patient. Patient will follow up for her sonohysterogram/blood test results and then we'll go from there. If cavitary abnormalities such as polyps/submucous myomas I reviewed possible hysteroscopic resection.     Dara LordsFONTAINE,Laylani Pudwill P MD, 9:07 AM 04/09/2014

## 2014-04-09 NOTE — Patient Instructions (Signed)
Follow up for ultrasound as scheduled 

## 2014-04-11 ENCOUNTER — Other Ambulatory Visit: Payer: Self-pay | Admitting: Gynecology

## 2014-04-11 DIAGNOSIS — N921 Excessive and frequent menstruation with irregular cycle: Secondary | ICD-10-CM

## 2014-04-18 ENCOUNTER — Other Ambulatory Visit: Payer: Self-pay | Admitting: Gynecology

## 2014-04-18 ENCOUNTER — Ambulatory Visit (INDEPENDENT_AMBULATORY_CARE_PROVIDER_SITE_OTHER): Payer: BLUE CROSS/BLUE SHIELD

## 2014-04-18 ENCOUNTER — Encounter: Payer: Self-pay | Admitting: Gynecology

## 2014-04-18 ENCOUNTER — Ambulatory Visit (INDEPENDENT_AMBULATORY_CARE_PROVIDER_SITE_OTHER): Payer: BLUE CROSS/BLUE SHIELD | Admitting: Gynecology

## 2014-04-18 VITALS — BP 114/70

## 2014-04-18 DIAGNOSIS — Z803 Family history of malignant neoplasm of breast: Secondary | ICD-10-CM

## 2014-04-18 DIAGNOSIS — N946 Dysmenorrhea, unspecified: Secondary | ICD-10-CM

## 2014-04-18 DIAGNOSIS — N921 Excessive and frequent menstruation with irregular cycle: Secondary | ICD-10-CM

## 2014-04-18 NOTE — Patient Instructions (Signed)
Office will call you with biopsy results 

## 2014-04-18 NOTE — Progress Notes (Signed)
[name] 04/24/1980 [id number]        34 y.o.  [id number] Presents for sonohysterogram with history of heavy irregular menses. Had been on NuvaRing previously with regular menses. Had tubal sterilization 11/2013. Menses in November were heavy and she has had seven-day flows 2 weeks to 4 weeks intervals. No spotting or bleeding in between. Some cramping along with her menses. Notes having been on some sort of hormonal suppression since adolescence to regulate her menses.  TSH and CBC were normal with hemoglobin of 13.  Past medical history,surgical history, problem list, medications, allergies, family history and social history were all reviewed and documented in the EPIC chart.  Directed ROS with pertinent positives and negatives documented in the history of present illness/assessment and plan.  Exam: Marlou StarksPam Summers assistant General appearance:  Normal Pelvic external BUS vagina normal. Cervix high in the vaginal vault flush with the upper vagina. Uterus retroverted normal size midline mobile. Adnexa without masses  Ultrasound transabdominal and transvaginal shows uterus normal size and echotexture. Retroverted. Endometrial echo 2.7 mm normal in appearance. Right and left ovaries normal. Cul-de-sac with trace of free fluid.  Sonohysterogram performed with difficulty placing the catheter requiring slight dilatation. Catheter noted to be mid endometrial cavity unable to thread to the fundus due to the retroversion. Due to discomfort tenaculum manipulation was not done. Injection of fluid showed pulsatile distention of the endometrial cavity with no abnormalities noted. Endometrial sample taken. Patient tolerated well.  Assessment/Plan:  34 y.o. [name] with above history. Patient will follow up for the biopsy results. Options for management again reviewed to include hormonal manipulation. She has done well with oral contraceptives in the past. She is status post tubal sterilization now. Options for  endometrial ablation reviewed but her cervix is difficult to negotiate and I reviewed with her the higher risk of perforation/failure to perform the ablation based on her anatomy. Ultimate hysterectomy option also reviewed. She did note that there was significant difficulty placing and removing her Mirena IUD previously by Dr. Lily PeerFernandez due to her anatomy. Patient will take of her options and follow up for the biopsy results in several days.     Dara LordsFONTAINE,Calel Pisarski P MD, 10:14 AM 04/18/2014

## 2014-04-22 ENCOUNTER — Telehealth: Payer: Self-pay | Admitting: *Deleted

## 2014-04-22 MED ORDER — NORETHINDRONE ACET-ETHINYL EST 1-20 MG-MCG PO TABS: 1.0000 | ORAL_TABLET | Freq: Every day | ORAL | Status: DC

## 2014-04-22 NOTE — Telephone Encounter (Signed)
Tell her that the biopsy was benign. I think at this point I would suggest starting on the low-dose birth control pills to regulate her menses. Loestrin 120 equivalent with refills through her next annual exam.  The only other real option would be hysterectomy. I do not think trying the Mirena IUD again or ablation would be technically feasible.

## 2014-04-22 NOTE — Telephone Encounter (Signed)
Pt informed with the below note, Rx sent. 

## 2014-04-22 NOTE — Telephone Encounter (Signed)
Pt called requesting pathology report on 04/18/14. Please advise

## 2014-04-23 ENCOUNTER — Other Ambulatory Visit: Payer: Self-pay

## 2014-04-23 ENCOUNTER — Telehealth: Payer: Self-pay | Admitting: *Deleted

## 2014-04-23 DIAGNOSIS — Z1231 Encounter for screening mammogram for malignant neoplasm of breast: Secondary | ICD-10-CM

## 2014-04-23 DIAGNOSIS — Z803 Family history of malignant neoplasm of breast: Secondary | ICD-10-CM

## 2014-04-23 NOTE — Telephone Encounter (Signed)
Appointment 04/29/14 @ 1:20pm

## 2014-04-23 NOTE — Telephone Encounter (Signed)
Per note on OV 10/15/14 "Reported maternal history of breast cancer age [age],I did recommend baseline mammogram now with this history she's going to call and arrange. [facility] will call to schedule.

## 2014-04-29 ENCOUNTER — Ambulatory Visit: Payer: Self-pay

## 2014-04-29 ENCOUNTER — Encounter (INDEPENDENT_AMBULATORY_CARE_PROVIDER_SITE_OTHER): Payer: Self-pay

## 2014-04-29 ENCOUNTER — Ambulatory Visit
Admission: RE | Admit: 2014-04-29 | Discharge: 2014-04-29 | Disposition: A | Discharge status: Home / Self Care | Payer: BLUE CROSS/BLUE SHIELD | Source: Ambulatory Visit

## 2014-04-29 DIAGNOSIS — Z803 Family history of malignant neoplasm of breast: Secondary | ICD-10-CM

## 2014-04-29 DIAGNOSIS — Z1231 Encounter for screening mammogram for malignant neoplasm of breast: Secondary | ICD-10-CM

## 2014-04-30 ENCOUNTER — Other Ambulatory Visit: Payer: Self-pay | Admitting: Family Medicine

## 2014-05-05 ENCOUNTER — Telehealth: Payer: Self-pay | Admitting: *Deleted

## 2014-05-05 MED ORDER — MEDROXYPROGESTERONE ACETATE 10 MG PO TABS: 10.0000 mg | ORAL_TABLET | Freq: Every day | ORAL | Status: DC

## 2014-05-05 MED ORDER — IBUPROFEN 800 MG PO TABS: 800.0000 mg | ORAL_TABLET | Freq: Three times a day (TID) | ORAL | Status: DC | PRN

## 2014-05-05 NOTE — Telephone Encounter (Signed)
Left message for pt to call.

## 2014-05-05 NOTE — Telephone Encounter (Signed)
Provea and Ibuoprofen Rx sent to pharmacy.

## 2014-05-05 NOTE — Telephone Encounter (Signed)
Pt called c/o no cycle since 04/05/14, cycle should have started on 04/30/14. Had tubal ligation, has all symptoms breast tenderness, cramping, not started birth control pills yet waiting for cycle. Pt also asked about her mammogram results gave results, states recommendation was repeating mammogram screening at 40. Pt would like to know if you think the same as well due to family history of breast cancer at age 34. Pt would like recommendations. Please advise

## 2014-05-05 NOTE — Telephone Encounter (Signed)
Ibuprofen 800 mg #60 one by mouth every 8 hour when necessary pain 1 refill

## 2014-05-05 NOTE — Telephone Encounter (Signed)
Recommend Provera 10 mg twice a day 5 days to bring on menses.  Call if still without bleeding after this. I do not agree with the wait until 40 given her history. I would repeat at an every year or every other year interval. We can talk about this at her annual exam.

## 2014-05-05 NOTE — Telephone Encounter (Signed)
Pt informed with the below note, pt asked if you could increase ibuprofen to 800 mg? Pt is taking ibuprofen 600 mg 2 pills daily for cramping. Please advise

## 2014-11-11 ENCOUNTER — Telehealth: Payer: Self-pay | Admitting: *Deleted

## 2014-11-11 NOTE — Telephone Encounter (Signed)
Chances are very low but possible. She needs to check a pregnancy test.

## 2014-11-11 NOTE — Telephone Encounter (Signed)
Pt informed with the below. 

## 2014-11-11 NOTE — Telephone Encounter (Signed)
Pt had tubal ligation was done in OCT, had tubes clamped. Pt states her cycle is late this month, not taking a UPT yet. She asked if you know the percentage rate if any of chance of pregnancy. Pt states her cycle is always on time and the only time it has been late in when she was pregnant. Please advise

## 2014-11-25 ENCOUNTER — Encounter: Payer: Self-pay | Admitting: Women's Health

## 2014-11-25 ENCOUNTER — Ambulatory Visit (INDEPENDENT_AMBULATORY_CARE_PROVIDER_SITE_OTHER): Payer: BLUE CROSS/BLUE SHIELD | Admitting: Women's Health

## 2014-11-25 VITALS — BP 120/70 | Ht 63.0 in | Wt 151.0 lb

## 2014-11-25 DIAGNOSIS — Z23 Encounter for immunization: Secondary | ICD-10-CM | POA: Diagnosis not present

## 2014-11-25 DIAGNOSIS — B009 Herpesviral infection, unspecified: Secondary | ICD-10-CM | POA: Diagnosis not present

## 2014-11-25 DIAGNOSIS — Z01419 Encounter for gynecological examination (general) (routine) without abnormal findings: Secondary | ICD-10-CM

## 2014-11-25 LAB — LIPID PANEL
Cholesterol: 192 mg/dL (ref 125–200)
HDL: 50 mg/dL (ref >=46)
LDL CALC: 127 mg/dL (ref <130)
TRIGLYCERIDES: 74 mg/dL (ref <150)
Total CHOL/HDL Ratio: 3.8 Ratio (ref <=5.0)
VLDL: 15 mg/dL (ref <30)

## 2014-11-25 LAB — CBC WITH DIFFERENTIAL/PLATELET
BASOS PCT: 0 % (ref 0–1)
Basophils Absolute: 0 10*3/uL (ref 0.0–0.1)
Eosinophils Absolute: 0.1 10*3/uL (ref 0.0–0.7)
Eosinophils Relative: 1 % (ref 0–5)
HCT: 38.3 % (ref 36.0–46.0)
Hemoglobin: 12.6 g/dL (ref 12.0–15.0)
Lymphocytes Relative: 22 % (ref 12–46)
Lymphs Abs: 1.8 10*3/uL (ref 0.7–4.0)
MCH: 28.5 pg (ref 26.0–34.0)
MCHC: 32.9 g/dL (ref 30.0–36.0)
MCV: 86.7 fL (ref 78.0–100.0)
MPV: 10.4 fL (ref 8.6–12.4)
Monocytes Absolute: 0.4 10*3/uL (ref 0.1–1.0)
Monocytes Relative: 5 % (ref 3–12)
NEUTROS ABS: 5.8 10*3/uL (ref 1.7–7.7)
NEUTROS PCT: 72 % (ref 43–77)
Platelets: 340 10*3/uL (ref 150–400)
RBC: 4.42 MIL/uL (ref 3.87–5.11)
RDW: 13.2 % (ref 11.5–15.5)
WBC: 8.1 10*3/uL (ref 4.0–10.5)

## 2014-11-25 LAB — TSH: TSH: 1.245 u[IU]/mL (ref 0.350–4.500)

## 2014-11-25 LAB — GLUCOSE, RANDOM: GLUCOSE: 84 mg/dL (ref 65–99)

## 2014-11-25 MED ORDER — VALACYCLOVIR HCL 500 MG PO TABS: ORAL_TABLET | ORAL | Status: DC

## 2014-11-25 MED ORDER — IBUPROFEN 800 MG PO TABS: 800.0000 mg | ORAL_TABLET | Freq: Three times a day (TID) | ORAL | Status: DC | PRN

## 2014-11-25 NOTE — Progress Notes (Signed)
[name] Jun 20, 1980 [id number]    History:    Presents for annual exam.  Monthly cycle every 23-27 days for 7 days. Menorrhagia negative endometrial biopsy 03/2014, difficulty assessing  uterus, ablation would be difficult per Dr. Audie Box. Normal Paps. 2015 BTL. HSV 2 rare outbreaks. Mother breast cancer age 34, survivor.  Past medical history, past surgical history, family history and social history were all reviewed and documented in the EPIC chart. Works at a bank. Daughter 35, son 4 both doing well. Mother deserted family when  a teenager, raised by father.  ROS:  A ROS was performed and pertinent positives and negatives are included.  Exam:  Filed Vitals:   11/25/14 0856  BP: 120/70    General appearance:  Normal Thyroid:  Symmetrical, normal in size, without palpable masses or nodularity. Respiratory  Auscultation:  Clear without wheezing or rhonchi Cardiovascular  Auscultation:  Regular rate, without rubs, murmurs or gallops  Edema/varicosities:  Not grossly evident Abdominal  Soft,nontender, without masses, guarding or rebound.  Liver/spleen:  No organomegaly noted  Hernia:  None appreciated  Skin  Inspection:  Grossly normal   Breasts: Examined lying and sitting.     Right: Without masses, retractions, discharge or axillary adenopathy.     Left: Without masses, retractions, discharge or axillary adenopathy. Gentitourinary   Inguinal/mons:  Normal without inguinal adenopathy  External genitalia:  Normal  BUS/Urethra/Skene's glands:  Normal  Vagina:  Normal  Cervix:  Normal  Uterus:   normal in size, shape and contour.  Midline and mobile  Adnexa/parametria:     Rt: Without masses or tenderness.   Lt: Without masses or tenderness.  Anus and perineum: Normal  Digital rectal exam: Normal sphincter tone without palpated masses or tenderness  Assessment/Plan:  34 y.o. M BF G4 P2  for annual exam.    Cycles every 23-25 days with menorrhagia/BTL HSV 2 rare  outbreaks  Plan: Options reviewed, states did not feel well on pills, uses Motrin with cycles. Valtrex 500 twice daily for 3-5 days if needed. Declines prescription today will call if needed. SBE's, continue annual screening mammogram/mother history, regular exercise, calcium rich diet, vitamin D 1000 daily encouraged. Contemplating hysterectomy, ( IUD problems, ablation unable to be done, did not feel well on OCs.) CBC, TSH, UA, Pap normal 2015, new screening guidelines reviewed.   [facility], 1:36 PM 11/25/2014

## 2014-11-25 NOTE — Patient Instructions (Signed)

## 2014-11-26 LAB — URINALYSIS W MICROSCOPIC + REFLEX CULTURE
Bacteria, UA: NONE SEEN [HPF]
Bilirubin Urine: NEGATIVE
Casts: NONE SEEN [LPF]
Crystals: NONE SEEN [HPF]
GLUCOSE, UA: NEGATIVE
KETONES UR: NEGATIVE
LEUKOCYTES UA: NEGATIVE
NITRITE: NEGATIVE
PH: 6 (ref 5.0–8.0)
Protein, ur: NEGATIVE
RBC / HPF: NONE SEEN RBC/HPF (ref <=2)
Specific Gravity, Urine: 1.022 (ref 1.001–1.035)
YEAST: NONE SEEN [HPF]

## 2014-11-27 LAB — URINE CULTURE: Colony Count: 9000

## 2014-12-16 ENCOUNTER — Encounter: Payer: Self-pay | Admitting: Women's Health

## 2014-12-16 ENCOUNTER — Ambulatory Visit (INDEPENDENT_AMBULATORY_CARE_PROVIDER_SITE_OTHER): Payer: BLUE CROSS/BLUE SHIELD | Admitting: Women's Health

## 2014-12-16 VITALS — BP 128/80 | Ht 63.0 in | Wt 151.0 lb

## 2014-12-16 DIAGNOSIS — N760 Acute vaginitis: Secondary | ICD-10-CM

## 2014-12-16 DIAGNOSIS — N920 Excessive and frequent menstruation with regular cycle: Secondary | ICD-10-CM

## 2014-12-16 DIAGNOSIS — N3001 Acute cystitis with hematuria: Secondary | ICD-10-CM | POA: Diagnosis not present

## 2014-12-16 DIAGNOSIS — A499 Bacterial infection, unspecified: Secondary | ICD-10-CM | POA: Diagnosis not present

## 2014-12-16 DIAGNOSIS — B9689 Other specified bacterial agents as the cause of diseases classified elsewhere: Secondary | ICD-10-CM

## 2014-12-16 DIAGNOSIS — R350 Frequency of micturition: Secondary | ICD-10-CM | POA: Diagnosis not present

## 2014-12-16 LAB — WET PREP FOR TRICH, YEAST, CLUE
TRICH WET PREP: NONE SEEN
WBC WET PREP: NONE SEEN
Yeast Wet Prep HPF POC: NONE SEEN

## 2014-12-16 LAB — URINALYSIS W MICROSCOPIC + REFLEX CULTURE
Bilirubin Urine: NEGATIVE
CRYSTALS: NONE SEEN [HPF]
Casts: NONE SEEN [LPF]
GLUCOSE, UA: NEGATIVE
KETONES UR: NEGATIVE
Nitrite: POSITIVE — AB
PH: 6.5 (ref 5.0–8.0)
Specific Gravity, Urine: >1.03 (ref 1.001–1.035)
YEAST: NONE SEEN [HPF]

## 2014-12-16 MED ORDER — METRONIDAZOLE 500 MG PO TABS: 500.0000 mg | ORAL_TABLET | Freq: Two times a day (BID) | ORAL | Status: DC

## 2014-12-16 MED ORDER — TRANEXAMIC ACID 650 MG PO TABS: 1300.0000 mg | ORAL_TABLET | Freq: Three times a day (TID) | ORAL | Status: DC

## 2014-12-16 MED ORDER — NITROFURANTOIN MONOHYD MACRO 100 MG PO CAPS: 100.0000 mg | ORAL_CAPSULE | Freq: Two times a day (BID) | ORAL | Status: AC

## 2014-12-16 NOTE — Patient Instructions (Addendum)
Hysterectomy Information  A hysterectomy is a surgery in which your uterus is removed. This surgery may be done to treat various medical problems. After the surgery, you will no longer have menstrual periods. The surgery will also make you unable to become pregnant (sterile). The fallopian tubes and ovaries can be removed (bilateral salpingo-oophorectomy) during this surgery as well.  REASONS FOR A HYSTERECTOMY  Persistent, abnormal bleeding.  Lasting (chronic) pelvic pain or infection.  The lining of the uterus (endometrium) starts growing outside the uterus (endometriosis).  The endometrium starts growing in the muscle of the uterus (adenomyosis).  The uterus falls down into the vagina (pelvic organ prolapse).  Noncancerous growths in the uterus (uterine fibroids) that cause symptoms.  Precancerous cells.  Cervical cancer or uterine cancer. TYPES OF HYSTERECTOMIES  Supracervical hysterectomy--In this type, the top part of the uterus is removed, but not the cervix.  Total hysterectomy--The uterus and cervix are removed.  Radical hysterectomy--The uterus, the cervix, and the fibrous tissue that holds the uterus in place in the pelvis (parametrium) are removed. WAYS A HYSTERECTOMY CAN BE PERFORMED  Abdominal hysterectomy--A large surgical cut (incision) is made in the abdomen. The uterus is removed through this incision.  Vaginal hysterectomy--An incision is made in the vagina. The uterus is removed through this incision. There are no abdominal incisions.  Conventional laparoscopic hysterectomy--Three or four small incisions are made in the abdomen. A thin, lighted tube with a camera (laparoscope) is inserted into one of the incisions. Other tools are put through the other incisions. The uterus is cut into small pieces. The small pieces are removed through the incisions, or they are removed through the vagina.  Laparoscopically assisted vaginal hysterectomy (LAVH)--Three or four  small incisions are made in the abdomen. Part of the surgery is performed laparoscopically and part vaginally. The uterus is removed through the vagina.  Robot-assisted laparoscopic hysterectomy--A laparoscope and other tools are inserted into 3 or 4 small incisions in the abdomen. A computer-controlled device is used to give the surgeon a 3D image and to help control the surgical instruments. This allows for more precise movements of surgical instruments. The uterus is cut into small pieces and removed through the incisions or removed through the vagina. RISKS AND COMPLICATIONS  Possible complications associated with this procedure include:  Bleeding and risk of blood transfusion. Tell your health care provider if you do not want to receive any blood products.  Blood clots in the legs or lung.  Infection.  Injury to surrounding organs.  Problems or side effects related to anesthesia.  Conversion to an abdominal hysterectomy from one of the other techniques. WHAT TO EXPECT AFTER A HYSTERECTOMY  You will be given pain medicine.  You will need to have someone with you for the first 3-5 days after you go home.  You will need to follow up with your surgeon in 2-4 weeks after surgery to evaluate your progress.  You may have early menopause symptoms such as hot flashes, night sweats, and insomnia.  If you had a hysterectomy for a problem that was not cancer or not a condition that could lead to cancer, then you no longer need Pap tests. However, even if you no longer need a Pap test, a regular exam is a good idea to make sure no other problems are starting.   This information is not intended to replace advice given to you by your health care provider. Make sure you discuss any questions you have with your health care   provider.   Document Released: 08/03/2000 Document Revised: 11/28/2012 Document Reviewed: 10/15/2012 Elsevier Interactive Patient Education 2016 Else[employer] Urinary Tract  Infection Urinary tract infections (UTIs) can develop anywhere along your urinary tract. Your urinary tract is your body's drainage system for removing wastes and extra water. Your urinary tract includes two kidneys, two ureters, a bladder, and a urethra. Your kidneys are a pair of bean-shaped organs. Each kidney is about the size of your fist. They are located below your ribs, one on each side of your spine. CAUSES Infections are caused by microbes, which are microscopic organisms, including fungi, viruses, and bacteria. These organisms are so small that they can only be seen through a microscope. Bacteria are the microbes that most commonly cause UTIs. SYMPTOMS  Symptoms of UTIs may vary by age and gender of the patient and by the location of the infection. Symptoms in [place] women typically include a frequent and intense urge to urinate and a painful, burning feeling in the bladder or urethra during urination. Older women and men are more likely to be tired, shaky, and weak and have muscle aches and abdominal pain. A fever may mean the infection is in your kidneys. Other symptoms of a kidney infection include pain in your back or sides below the ribs, nausea, and vomiting. DIAGNOSIS To diagnose a UTI, your caregiver will ask you about your symptoms. Your caregiver will also ask you to provide a urine sample. The urine sample will be tested for bacteria and white blood cells. White blood cells are made by your body to help fight infection. TREATMENT  Typically, UTIs can be treated with medication. Because most UTIs are caused by a bacterial infection, they usually can be treated with the use of antibiotics. The choice of antibiotic and length of treatment depend on your symptoms and the type of bacteria causing your infection. HOME CARE INSTRUCTIONS  If you were prescribed antibiotics, take them exactly as your caregiver instructs you. Finish the medication even if you feel better after you have only  taken some of the medication.  Drink enough water and fluids to keep your urine clear or pale yellow.  Avoid caffeine, tea, and carbonated beverages. They tend to irritate your bladder.  Empty your bladder often. Avoid holding urine for long periods of time.  Empty your bladder before and after sexual intercourse.  After a bowel movement, women should cleanse from front to back. Use each tissue only once. SEEK MEDICAL CARE IF:   You have back pain.  You develop a fever.  Your symptoms do not begin to resolve within 3 days. SEEK IMMEDIATE MEDICAL CARE IF:   You have severe back pain or lower abdominal pain.  You develop chills.  You have nausea or vomiting.  You have continued burning or discomfort with urination. MAKE SURE YOU:   Understand these instructions.  Will watch your condition.  Will get help right away if you are not doing well or get worse.   This information is not intended to replace advice given to you by your health care provider. Make sure you discuss any questions you have with your health care provider.   Document Released: 11/17/2004 Document Revised: 10/29/2014 Document Reviewed: 03/18/2011 Elsevier Interactive Patient Education [employer]2016 Else[employer] Inc[employer]

## 2014-12-16 NOTE — Progress Notes (Signed)
Presents today for urinary urgency x 1d. Has history of overactive bladder with urinary frequency and UTIs. Last UTI was in 2013 prior to tubal ligation. Denies odor, discharge, hematuria, lower abdominal pain, or fever. Also complains of dysmenorrhea and menorrhagia, having to miss work during cycles, changing pads every hour. Cycles are monthly lasting 5-7 days first 4 days extremely heavy. Had contemplated endometrial ablation unable to dilate cervix.  Exam: Appears well. External genitalia within normal limits. Speculum exam reveals no erythema, inflammation, or lesions. Scant discharge present. No cervical motion tenderness. No odor. Wet prep shows a few clue cells and many bacteria. UA is positive for nitrites, 1+ leukocytes, 1+ protein, 1+ blood,  20-40 WBCs,  20-40 RBCs, many bacteria  UTI Bacterial vaginosis Menorrhagia  Plan: Macrobid 100 mg tablets by mouth twice a day for 7 days. Flagyl 500 mg tablets by mouth twice a day for 7 days. Instructed to keep area clean, limit sweet tea, caffeinated drinks and juices. Drink more water. Urine culture pending. Lysteda 650 mg, take 2 tablets 3 times a day during menstrual cycles as needed. Discussed hysterectomy, given educational materials, is hoping to schedule with Dr. for Audie BoxFontaine summer of 2017. Call if symptoms worsen or do not resolve.

## 2014-12-17 LAB — URINE CULTURE
Colony Count: NO GROWTH
ORGANISM ID, BACTERIA: NO GROWTH

## 2015-05-04 ENCOUNTER — Other Ambulatory Visit: Payer: Self-pay

## 2015-05-04 DIAGNOSIS — Z1231 Encounter for screening mammogram for malignant neoplasm of breast: Secondary | ICD-10-CM

## 2015-05-20 ENCOUNTER — Ambulatory Visit: Payer: BLUE CROSS/BLUE SHIELD

## 2015-06-05 ENCOUNTER — Ambulatory Visit
Admission: RE | Admit: 2015-06-05 | Discharge: 2015-06-05 | Disposition: A | Discharge status: Home / Self Care | Payer: BLUE CROSS/BLUE SHIELD | Source: Ambulatory Visit

## 2015-06-05 DIAGNOSIS — Z1231 Encounter for screening mammogram for malignant neoplasm of breast: Secondary | ICD-10-CM | POA: Diagnosis not present

## 2015-11-27 ENCOUNTER — Encounter: Payer: Self-pay | Admitting: Women's Health

## 2015-11-27 ENCOUNTER — Ambulatory Visit (INDEPENDENT_AMBULATORY_CARE_PROVIDER_SITE_OTHER): Payer: BLUE CROSS/BLUE SHIELD | Admitting: Women's Health

## 2015-11-27 VITALS — BP 118/80 | Ht 63.0 in | Wt 154.0 lb

## 2015-11-27 DIAGNOSIS — B009 Herpesviral infection, unspecified: Secondary | ICD-10-CM | POA: Diagnosis not present

## 2015-11-27 DIAGNOSIS — Z13220 Encounter for screening for lipoid disorders: Secondary | ICD-10-CM | POA: Diagnosis not present

## 2015-11-27 DIAGNOSIS — N920 Excessive and frequent menstruation with regular cycle: Secondary | ICD-10-CM

## 2015-11-27 DIAGNOSIS — Z01419 Encounter for gynecological examination (general) (routine) without abnormal findings: Secondary | ICD-10-CM | POA: Diagnosis not present

## 2015-11-27 DIAGNOSIS — Z23 Encounter for immunization: Secondary | ICD-10-CM | POA: Diagnosis not present

## 2015-11-27 LAB — LIPID PANEL
CHOLESTEROL: 224 mg/dL — AB (ref 125–200)
HDL: 55 mg/dL (ref >=46)
LDL Cholesterol: 144 mg/dL — ABNORMAL HIGH (ref <130)
Total CHOL/HDL Ratio: 4.1 Ratio (ref <=5.0)
Triglycerides: 124 mg/dL (ref <150)
VLDL: 25 mg/dL (ref <30)

## 2015-11-27 LAB — CBC WITH DIFFERENTIAL/PLATELET
BASOS ABS: 0 {cells}/uL (ref 0–200)
Basophils Relative: 0 %
EOS ABS: 174 {cells}/uL (ref 15–500)
EOS PCT: 2 %
HEMATOCRIT: 38.6 % (ref 35.0–45.0)
Hemoglobin: 12.8 g/dL (ref 11.7–15.5)
LYMPHS ABS: 3132 {cells}/uL (ref 850–3900)
LYMPHS PCT: 36 %
MCH: 27.7 pg (ref 27.0–33.0)
MCHC: 33.2 g/dL (ref 32.0–36.0)
MCV: 83.5 fL (ref 80.0–100.0)
MONO ABS: 522 {cells}/uL (ref 200–950)
MPV: 10.9 fL (ref 7.5–12.5)
Monocytes Relative: 6 %
NEUTROS PCT: 56 %
Neutro Abs: 4872 cells/uL (ref 1500–7800)
Platelets: 327 10*3/uL (ref 140–400)
RBC: 4.62 MIL/uL (ref 3.80–5.10)
RDW: 14.5 % (ref 11.0–15.0)
WBC: 8.7 10*3/uL (ref 3.8–10.8)

## 2015-11-27 LAB — GLUCOSE, RANDOM: Glucose, Bld: 75 mg/dL (ref 65–99)

## 2015-11-27 MED ORDER — VALACYCLOVIR HCL 500 MG PO TABS: ORAL_TABLET | ORAL | 1 refills | Status: DC

## 2015-11-27 MED ORDER — TRANEXAMIC ACID 650 MG PO TABS: 1300.0000 mg | ORAL_TABLET | Freq: Three times a day (TID) | ORAL | 6 refills | Status: DC

## 2015-11-27 NOTE — Patient Instructions (Signed)
Health Maintenance, Female Adopting a healthy lifestyle and getting preventive care can go a long way to promote health and wellness. Talk with your health care provider about what schedule of regular examinations is right for you. This is a good chance for you to check in with your provider about disease prevention and staying healthy. In between checkups, there are plenty of things you can do on your own. Experts have done a lot of research about which lifestyle changes and preventive measures are most likely to keep you healthy. Ask your health care provider for more information. WEIGHT AND DIET  Eat a healthy diet  Be sure to include plenty of vegetables, fruits, low-fat dairy products, and lean protein.  Do not eat a lot of foods high in solid fats, added sugars, or salt.  Get regular exercise. This is one of the most important things you can do for your health.  Most adults should exercise for at least 150 minutes each week. The exercise should increase your heart rate and make you sweat (moderate-intensity exercise).  Most adults should also do strengthening exercises at least twice a week. This is in addition to the moderate-intensity exercise.  Maintain a healthy weight  Body mass index (BMI) is a measurement that can be used to identify possible weight problems. It estimates body fat based on height and weight. Your health care provider can help determine your BMI and help you achieve or maintain a healthy weight.  For females 20 years of age and older:   A BMI below 18.5 is considered underweight.  A BMI of 18.5 to 24.9 is normal.  A BMI of 25 to 29.9 is considered overweight.  A BMI of 30 and above is considered obese.  Watch levels of cholesterol and blood lipids  You should start having your blood tested for lipids and cholesterol at 35 years of age, then have this test every 5 years.  You may need to have your cholesterol levels checked more often if:  Your lipid  or cholesterol levels are high.  You are older than 35 years of age.  You are at high risk for heart disease.  CANCER SCREENING   Lung Cancer  Lung cancer screening is recommended for adults 55-80 years old who are at high risk for lung cancer because of a history of smoking.  A yearly low-dose CT scan of the lungs is recommended for people who:  Currently smoke.  Have quit within the past 15 years.  Have at least a 30-pack-year history of smoking. A pack year is smoking an average of one pack of cigarettes a day for 1 year.  Yearly screening should continue until it has been 15 years since you quit.  Yearly screening should stop if you develop a health problem that would prevent you from having lung cancer treatment.  Breast Cancer  Practice breast self-awareness. This means understanding how your breasts normally appear and feel.  It also means doing regular breast self-exams. Let your health care provider know about any changes, no matter how small.  If you are in your 20s or 30s, you should have a clinical breast exam (CBE) by a health care provider every 1-3 years as part of a regular health exam.  If you are 40 or older, have a CBE every year. Also consider having a breast X-ray (mammogram) every year.  If you have a family history of breast cancer, talk to your health care provider about genetic screening.  If you   are at high risk for breast cancer, talk to your health care provider about having an MRI and a mammogram every year.  Breast cancer gene (BRCA) assessment is recommended for women who have family members with BRCA-related cancers. BRCA-related cancers include:  Breast.  Ovarian.  Tubal.  Peritoneal cancers.  Results of the assessment will determine the need for genetic counseling and BRCA1 and BRCA2 testing. Cervical Cancer Your health care provider may recommend that you be screened regularly for cancer of the pelvic organs (ovaries, uterus, and  vagina). This screening involves a pelvic examination, including checking for microscopic changes to the surface of your cervix (Pap test). You may be encouraged to have this screening done every 3 years, beginning at age 21.  For women ages 30-65, health care providers may recommend pelvic exams and Pap testing every 3 years, or they may recommend the Pap and pelvic exam, combined with testing for human papilloma virus (HPV), every 5 years. Some types of HPV increase your risk of cervical cancer. Testing for HPV may also be done on women of any age with unclear Pap test results.  Other health care providers may not recommend any screening for nonpregnant women who are considered low risk for pelvic cancer and who do not have symptoms. Ask your health care provider if a screening pelvic exam is right for you.  If you have had past treatment for cervical cancer or a condition that could lead to cancer, you need Pap tests and screening for cancer for at least 20 years after your treatment. If Pap tests have been discontinued, your risk factors (such as having a new sexual partner) need to be reassessed to determine if screening should resume. Some women have medical problems that increase the chance of getting cervical cancer. In these cases, your health care provider may recommend more frequent screening and Pap tests. Colorectal Cancer  This type of cancer can be detected and often prevented.  Routine colorectal cancer screening usually begins at 35 years of age and continues through 35 years of age.  Your health care provider may recommend screening at an earlier age if you have risk factors for colon cancer.  Your health care provider may also recommend using home test kits to check for hidden blood in the stool.  A small camera at the end of a tube can be used to examine your colon directly (sigmoidoscopy or colonoscopy). This is done to check for the earliest forms of colorectal  cancer.  Routine screening usually begins at age 50.  Direct examination of the colon should be repeated every 5-10 years through 35 years of age. However, you may need to be screened more often if early forms of precancerous polyps or small growths are found. Skin Cancer  Check your skin from head to toe regularly.  Tell your health care provider about any new moles or changes in moles, especially if there is a change in a mole's shape or color.  Also tell your health care provider if you have a mole that is larger than the size of a pencil eraser.  Always use sunscreen. Apply sunscreen liberally and repeatedly throughout the day.  Protect yourself by wearing long sleeves, pants, a wide-brimmed hat, and sunglasses whenever you are outside. HEART DISEASE, DIABETES, AND HIGH BLOOD PRESSURE   High blood pressure causes heart disease and increases the risk of stroke. High blood pressure is more likely to develop in:  People who have blood pressure in the high end   of the normal range (130-139/85-89 mm Hg).  People who are overweight or obese.  People who are African American.  If you are 38-23 years of age, have your blood pressure checked every 3-5 years. If you are 61 years of age or older, have your blood pressure checked every year. You should have your blood pressure measured twice--once when you are at a hospital or clinic, and once when you are not at a hospital or clinic. Record the average of the two measurements. To check your blood pressure when you are not at a hospital or clinic, you can use:  An automated blood pressure machine at a pharmacy.  A home blood pressure monitor.  If you are between 45 years and 39 years old, ask your health care provider if you should take aspirin to prevent strokes.  Have regular diabetes screenings. This involves taking a blood sample to check your fasting blood sugar level.  If you are at a normal weight and have a low risk for diabetes,  have this test once every three years after 35 years of age.  If you are overweight and have a high risk for diabetes, consider being tested at a younger age or more often. PREVENTING INFECTION  Hepatitis B  If you have a higher risk for hepatitis B, you should be screened for this virus. You are considered at high risk for hepatitis B if:  You were born in a country where hepatitis B is common. Ask your health care provider which countries are considered high risk.  Your parents were born in a high-risk country, and you have not been immunized against hepatitis B (hepatitis B vaccine).  You have HIV or AIDS.  You use needles to inject street drugs.  You live with someone who has hepatitis B.  You have had sex with someone who has hepatitis B.  You get hemodialysis treatment.  You take certain medicines for conditions, including cancer, organ transplantation, and autoimmune conditions. Hepatitis C  Blood testing is recommended for:  Everyone born from [birth year] through 1965.  Anyone with known risk factors for hepatitis C. Sexually transmitted infections (STIs)  You should be screened for sexually transmitted infections (STIs) including gonorrhea and chlamydia if:  You are sexually active and are younger than 35 years of age.  You are older than 35 years of age and your health care provider tells you that you are at risk for this type of infection.  Your sexual activity has changed since you were last screened and you are at an increased risk for chlamydia or gonorrhea. Ask your health care provider if you are at risk.  If you do not have HIV, but are at risk, it may be recommended that you take a prescription medicine daily to prevent HIV infection. This is called pre-exposure prophylaxis (PrEP). You are considered at risk if:  You are sexually active and do not regularly use condoms or know the HIV status of your partner(s).  You take drugs by injection.  You are sexually  active with a partner who has HIV. Talk with your health care provider about whether you are at high risk of being infected with HIV. If you choose to begin PrEP, you should first be tested for HIV. You should then be tested every 3 months for as long as you are taking PrEP.  PREGNANCY   If you are premenopausal and you may become pregnant, ask your health care provider about preconception counseling.  If you may  become pregnant, take 400 to 800 micrograms (mcg) of folic acid every day.  If you want to prevent pregnancy, talk to your health care provider about birth control (contraception). OSTEOPOROSIS AND MENOPAUSE   Osteoporosis is a disease in which the bones lose minerals and strength with aging. This can result in serious bone fractures. Your risk for osteoporosis can be identified using a bone density scan.  If you are 61 years of age or older, or if you are at risk for osteoporosis and fractures, ask your health care provider if you should be screened.  Ask your health care provider whether you should take a calcium or vitamin D supplement to lower your risk for osteoporosis.  Menopause may have certain physical symptoms and risks.  Hormone replacement therapy may reduce some of these symptoms and risks. Talk to your health care provider about whether hormone replacement therapy is right for you.  HOME CARE INSTRUCTIONS   Schedule regular health, dental, and eye exams.  Stay current with your immunizations.   Do not use any tobacco products including cigarettes, chewing tobacco, or electronic cigarettes.  If you are pregnant, do not drink alcohol.  If you are breastfeeding, limit how much and how often you drink alcohol.  Limit alcohol intake to no more than 1 drink per day for nonpregnant women. One drink equals 12 ounces of beer, 5 ounces of wine, or 1 ounces of hard liquor.  Do not use street drugs.  Do not share needles.  Ask your health care provider for help if  you need support or information about quitting drugs.  Tell your health care provider if you often feel depressed.  Tell your health care provider if you have ever been abused or do not feel safe at home.   This information is not intended to replace advice given to you by your health care provider. Make sure you discuss any questions you have with your health care provider.   Document Released: 08/23/2010 Document Revised: 02/28/2014 Document Reviewed: 01/09/2013 Elsevier Interactive Patient Education [employer].

## 2015-11-27 NOTE — Progress Notes (Signed)
[name] [birth year] [id number]    History:    Presents for annual exam.  Monthly 6-7 day heavy cycle BTL. 2004 LEEP with negative margins, normal Paps after. History of endometriosis. Negative endometrial biopsy 2016. Did not do well with Mirena IUD or OCs, not a candidate for endometrial ablation per Dr. Phineas Real. Reports  every other month cycle very heavy, using Lysteda with relief monthly. Annual mammograms. Mother history of breast cancer at age 49 survivor had recurrence unknown whereabouts no contact with her mother or BRCA status. Was raised by her father.  Past medical history, past surgical history, family history and social history were all reviewed and documented in the EPIC chart. 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 Father lung cancer doing better.  ROS:  A ROS was performed and pertinent positives and negatives are included.  Exam:  Vitals:   11/27/15 1600  BP: 118/80  Weight: 154 lb (69.9 kg)  Height: _0  (1.6 m)   Body mass index is 27.28 kg/m.   General appearance:  Normal Thyroid:  Symmetrical, normal in size, without palpable masses or nodularity. Respiratory  Auscultation:  Clear without wheezing or rhonchi Cardiovascular  Auscultation:  Regular rate, without rubs, murmurs or gallops  Edema/varicosities:  Not grossly evident Abdominal  Soft,nontender, without masses, guarding or rebound.  Liver/spleen:  No organomegaly noted  Hernia:  None appreciated  Skin  Inspection:  Grossly normal   Breasts: Examined lying and sitting.     Right: Without masses, retractions, discharge or axillary adenopathy.     Left: Without masses, retractions, discharge or axillary adenopathy. Gentitourinary   Inguinal/mons:  Normal without inguinal adenopathy  External genitalia:  Normal  BUS/Urethra/Skene's glands:  Normal  Vagina:  Normal  Cervix: Short/tilts left  Uterus:   normal in size, shape and contour.  Midline and mobile  Adnexa/parametria:      Rt: Without masses or tenderness.   Lt: Without masses or tenderness.  Anus and perineum: Normal  Digital rectal exam: Normal sphincter tone without palpated masses or tenderness  Assessment/Plan:  35 y.o. MBF G4 P2 for annual exam.   Monthly cycle/BTL/menorrhagia HSV rare outbreaks Mother breast cancer age 35 survivorship/BRCA status unknown  Plan: Lysteda 650 mg 2 tablets 3 times daily with cycles, prescription, proper use given and reviewed. Valtrex 500 twice daily for 3-5 days when necessary prescription, proper use given and reviewed. Keep menstrual record, instructed to call if cycles greater than 7 days. SBE's and continue annual screening mammogram, exercise and MVI daily encouraged. CBC, glucose, lipid panel, UA, Pap with HR HPV typing, new screening guidelines reviewed.   Huel Cote [facility], 4:29 PM 11/27/2015

## 2015-11-28 LAB — URINALYSIS W MICROSCOPIC + REFLEX CULTURE
BACTERIA UA: NONE SEEN [HPF]
BILIRUBIN URINE: NEGATIVE
Casts: NONE SEEN [LPF]
Crystals: NONE SEEN [HPF]
GLUCOSE, UA: NEGATIVE
Hgb urine dipstick: NEGATIVE
Ketones, ur: NEGATIVE
LEUKOCYTES UA: NEGATIVE
Nitrite: NEGATIVE
PROTEIN: NEGATIVE
SPECIFIC GRAVITY, URINE: 1.025 (ref 1.001–1.035)
WBC UA: NONE SEEN WBC/HPF (ref <=5)
Yeast: NONE SEEN [HPF]
pH: 6.5 (ref 5.0–8.0)

## 2015-11-29 ENCOUNTER — Other Ambulatory Visit: Payer: Self-pay | Admitting: Women's Health

## 2015-12-01 DIAGNOSIS — Z01419 Encounter for gynecological examination (general) (routine) without abnormal findings: Secondary | ICD-10-CM | POA: Diagnosis not present

## 2015-12-01 DIAGNOSIS — Z23 Encounter for immunization: Secondary | ICD-10-CM | POA: Diagnosis not present

## 2015-12-01 LAB — URINE CULTURE

## 2015-12-01 NOTE — Addendum Note (Signed)
Addended by: Kem ParkinsonBARNES, Patrina Andreas on: 12/01/2015 08:27 AM   Modules accepted: Orders

## 2015-12-01 NOTE — Addendum Note (Signed)
Addended by: Kem ParkinsonBARNES, Tinesha Siegrist on: 12/01/2015 08:24 AM   Modules accepted: Orders

## 2015-12-04 LAB — PAP, TP IMAGING W/ HPV RNA, RFLX HPV TYPE 16,18/45: HPV mRNA, High Risk: NOT DETECTED

## 2016-01-04 ENCOUNTER — Telehealth: Payer: Self-pay | Admitting: *Deleted

## 2016-01-04 NOTE — Telephone Encounter (Signed)
Pt informed with normal pap smear results on 12/01/15

## 2016-01-11 ENCOUNTER — Telehealth: Payer: Self-pay | Admitting: *Deleted

## 2016-01-11 MED ORDER — ETONOGESTREL-ETHINYL ESTRADIOL 0.12-0.015 MG/24HR VA RING: 1.0000 | VAGINAL_RING | VAGINAL | 11 refills | Status: DC

## 2016-01-11 NOTE — Telephone Encounter (Signed)
Pt informed with the below note, Rx sent. 

## 2016-01-11 NOTE — Telephone Encounter (Signed)
Ok nuva ring place when cycle lightening, keep in place 21-28 days or until bleeding starts again, take out only 4 days and replace. Have her call if cycles continue heavy.

## 2016-01-11 NOTE — Telephone Encounter (Signed)
Pt called asking if nuvaring Rx could be sent to pharmacy, states her cycles are heavy cycles. Please advise

## 2016-01-13 ENCOUNTER — Telehealth: Payer: Self-pay

## 2016-01-13 NOTE — Telephone Encounter (Signed)
I returned call to patient as CLaudia had left me a note that patient wanted codes for partial hysterectomy.  Patient said she wanted to check with her ins co to see which hysterectomy is covered 100%.  I explained usually applies to major medical and ded and co-ins generally apply.  I provided her CPT codes at her request for Abd Hyst, Vag Hyst and LAVH.  I explained no way I can know which one her physician will order for her.

## 2016-01-20 DIAGNOSIS — J209 Acute bronchitis, unspecified: Secondary | ICD-10-CM | POA: Diagnosis not present

## 2016-01-20 DIAGNOSIS — B9789 Other viral agents as the cause of diseases classified elsewhere: Secondary | ICD-10-CM | POA: Diagnosis not present

## 2016-01-20 DIAGNOSIS — J069 Acute upper respiratory infection, unspecified: Secondary | ICD-10-CM | POA: Diagnosis not present

## 2016-01-20 DIAGNOSIS — J0140 Acute pansinusitis, unspecified: Secondary | ICD-10-CM | POA: Diagnosis not present

## 2016-01-27 ENCOUNTER — Telehealth: Payer: Self-pay | Admitting: *Deleted

## 2016-01-27 NOTE — Telephone Encounter (Signed)
Patient called requesting Rx for birth control pills, body didn't do well with nuvaring that was prescribed on 01/11/16. Please advise

## 2016-01-27 NOTE — Telephone Encounter (Signed)
Okay, but have her try Alesse 1 daily start on Sunday after cycle starts or first day of bleeding. Reviewed purpose of Sunday start is to avoid cycles on the weekend.

## 2016-01-28 MED ORDER — LEVONORGESTREL-ETHINYL ESTRAD 0.1-20 MG-MCG PO TABS: 1.0000 | ORAL_TABLET | Freq: Every day | ORAL | 4 refills | Status: DC

## 2016-01-28 NOTE — Telephone Encounter (Signed)
Pt informed with the below note, Rx sent. 

## 2016-03-01 ENCOUNTER — Telehealth: Payer: Self-pay | Admitting: *Deleted

## 2016-03-01 NOTE — Telephone Encounter (Signed)
Pt started new birth control pills in dec, started on day 1 of cycle, never stopped bleeding 1 week period and now light flow changing pads 2-3 per day. Pink blood to bright red blood. I did explain to patient not abnormal to have irregular bleeding when starting new pills recommend trying 3 pack before switching pill. States pills are helping with cramping and flow.  Pt verbalized she understood, do you have any recommendations regarding bleeding/spotting, pt states it has been 1 month now. Please advise

## 2016-03-01 NOTE — Telephone Encounter (Signed)
Telephone call, states the flow is much lighter, cramping is less with the birth control pills. Will try 2 pills today, 2 tomorrow and will call if continued problems with bleeding. Has had problems with contraception in the past with irregular bleeding.

## 2016-03-03 ENCOUNTER — Other Ambulatory Visit: Payer: Self-pay | Admitting: Women's Health

## 2016-03-03 ENCOUNTER — Telehealth: Payer: Self-pay | Admitting: *Deleted

## 2016-03-03 DIAGNOSIS — Z3041 Encounter for surveillance of contraceptive pills: Secondary | ICD-10-CM

## 2016-03-03 MED ORDER — NORGESTREL-ETHINYL ESTRADIOL 0.3-30 MG-MCG PO TABS: 1.0000 | ORAL_TABLET | Freq: Every day | ORAL | 4 refills | Status: DC

## 2016-03-03 NOTE — Telephone Encounter (Signed)
TC states bleeding has decreased but has not stopped it is 75% better will continue pills daily Will try different pill Lo/Ovral will take 4 days off at the end of this pack and then start Lo/Ovral prescription E scribed. Instructed to call if continued problems. Has a retroverted uterus ablation, IUDs not feasible. States would like to proceed with hysterectomy but due to cost is willing to try another month of Lo/Ovral.

## 2016-03-03 NOTE — Telephone Encounter (Signed)
Pt called to follow up from telephone encounter on 03/01/16 pt doubled up on pills and the bleeding has not stopped, told to call. Please advise

## 2016-03-11 ENCOUNTER — Telehealth: Payer: Self-pay | Admitting: *Deleted

## 2016-03-11 ENCOUNTER — Other Ambulatory Visit: Payer: Self-pay | Admitting: Women's Health

## 2016-03-11 DIAGNOSIS — N938 Other specified abnormal uterine and vaginal bleeding: Secondary | ICD-10-CM

## 2016-03-11 MED ORDER — MEGESTROL ACETATE 40 MG PO TABS: 40.0000 mg | ORAL_TABLET | Freq: Two times a day (BID) | ORAL | 0 refills | Status: DC

## 2016-03-11 NOTE — Telephone Encounter (Signed)
Telephone call, states has continued to have multiple problems with menorrhagia and dysmenorrhea, not a candidate for ablation or IUD retroverted uterus. Will try Megace 40 mg twice a day after completing 10 days will start back on OCs daily without placebo week. Will schedule appointment with Dr. Audie BoxFontaine to discuss possible hysterectomy.

## 2016-03-11 NOTE — Telephone Encounter (Signed)
Pt called to follow up with telephone call on 03/03/16 states she stopped pills for 4 days as directed and bleeding and cramping increased, heavy bleeding still, went though 2 boxes of pads, sharp cramping. Heavy bleeding still now. Pt would speak with you. [phone]

## 2016-03-21 ENCOUNTER — Ambulatory Visit (INDEPENDENT_AMBULATORY_CARE_PROVIDER_SITE_OTHER): Payer: BLUE CROSS/BLUE SHIELD | Admitting: Gynecology

## 2016-03-21 ENCOUNTER — Encounter: Payer: Self-pay | Admitting: Gynecology

## 2016-03-21 VITALS — BP 130/80

## 2016-03-21 DIAGNOSIS — N921 Excessive and frequent menstruation with irregular cycle: Secondary | ICD-10-CM

## 2016-03-21 DIAGNOSIS — N938 Other specified abnormal uterine and vaginal bleeding: Secondary | ICD-10-CM | POA: Diagnosis not present

## 2016-03-21 LAB — CBC WITH DIFFERENTIAL/PLATELET
BASOS ABS: 0 {cells}/uL (ref 0–200)
BASOS PCT: 0 %
EOS ABS: 140 {cells}/uL (ref 15–500)
Eosinophils Relative: 2 %
HEMATOCRIT: 33.7 % — AB (ref 35.0–45.0)
Hemoglobin: 10.9 g/dL — ABNORMAL LOW (ref 11.7–15.5)
LYMPHS PCT: 31 %
Lymphs Abs: 2170 cells/uL (ref 850–3900)
MCH: 27.5 pg (ref 27.0–33.0)
MCHC: 32.3 g/dL (ref 32.0–36.0)
MCV: 85.1 fL (ref 80.0–100.0)
MONO ABS: 490 {cells}/uL (ref 200–950)
MONOS PCT: 7 %
MPV: 9.5 fL (ref 7.5–12.5)
NEUTROS PCT: 60 %
Neutro Abs: 4200 cells/uL (ref 1500–7800)
Platelets: 465 10*3/uL — ABNORMAL HIGH (ref 140–400)
RBC: 3.96 MIL/uL (ref 3.80–5.10)
RDW: 13.2 % (ref 11.0–15.0)
WBC: 7 10*3/uL (ref 3.8–10.8)

## 2016-03-21 MED ORDER — MEGESTROL ACETATE 40 MG PO TABS: 40.0000 mg | ORAL_TABLET | Freq: Two times a day (BID) | ORAL | 1 refills | Status: DC

## 2016-03-21 NOTE — Patient Instructions (Addendum)
Office will call to arrange surgery 

## 2016-03-21 NOTE — Progress Notes (Signed)
    [name] [birth year] [id number]        36 y.o.  [id number] presents to discuss hysterectomy. Patient has a long history of menorrhagia and irregular bleeding. Status post BTL in the past. Has tried several types of oral contraceptives without success for menstrual regulation. Is also tried Lysteda in the past. Currently on Megace 40 mg twice a day which has stopped her bleeding. Had Mirena IUD placed before and became embedded it was recommended by that physician who removed it never did have an IUD again.  Does have significant retroversion. Sonohysterogram was negative with negative endometrial biopsy. Due to the retroversion was difficult to thread the catheter and it was felt that endometrial ablation would be difficult. Does have a mother diagnosed with breast cancer in her 34s historically. She has no contact with her mother and does not know if she was genetically tested.   Past medical history,surgical history, problem list, medications, allergies, family history and social history were all reviewed and documented in the EPIC chart.  Directed ROS with pertinent positives and negatives documented in the history of present illness/assessment and plan.  Exam: Caryn Bee assistant Vitals:   03/21/16 0820  BP: 130/80   General appearance:  Normal Abdomen soft nontender without masses guarding rebound Pelvic external BUS vagina normal. Cervix grossly normal. Uterus grossly normal size midline mobile nontender. Adnexa without masses or tenderness.  Assessment/Plan:  36 y.o. [id number] with refractile menorrhagia and irregular bleeding who desires to proceed with hysterectomy. I reviewed options with her and she has pretty much has exhausted her options.  My recommendation would be to proceed with laparoscopic-assisted vaginal hysterectomy. I reviewed her mother's history of breast cancer and recommended genetic counseling and possible genetic testing. If she would be a carrier for a gene  then I would strongly recommend removing her ovaries at the time of surgery.  The patient declines referral stating she would not be genetically tested regardless. She wants to keep her ovaries and not take supplemental ERT. She understands that if she was a carrier for a genetic mutation that she could have a substantial risk of ovarian cancer approaching 40% with BRCA gene. Patient clearly understands this and declines genetic counseling or removal of her ovaries. I would suggest removal of her fallopian tubes as a risk reductive surgery and she agrees with this. Will check baseline CBC today. She'll stay on Megace 40 mg daily through the surgery. Also will supplement daily iron to maximize her hemoglobin preoperatively and minimize the risk of transfusion.    Anastasio Auerbach MD, 8:46 AM 03/21/2016

## 2016-04-08 ENCOUNTER — Encounter: Payer: Self-pay | Admitting: Gynecology

## 2016-04-08 DIAGNOSIS — Z0289 Encounter for other administrative examinations: Secondary | ICD-10-CM

## 2016-04-19 ENCOUNTER — Encounter: Payer: Self-pay | Admitting: Gynecology

## 2016-04-19 ENCOUNTER — Ambulatory Visit (INDEPENDENT_AMBULATORY_CARE_PROVIDER_SITE_OTHER): Payer: BLUE CROSS/BLUE SHIELD | Admitting: Gynecology

## 2016-04-19 VITALS — BP 120/70

## 2016-04-19 DIAGNOSIS — N921 Excessive and frequent menstruation with irregular cycle: Secondary | ICD-10-CM | POA: Diagnosis not present

## 2016-04-19 MED ORDER — PHENAZOPYRIDINE HCL 200 MG PO TABS: 200.0000 mg | ORAL_TABLET | Freq: Every day | ORAL | 0 refills | Status: DC

## 2016-04-19 NOTE — Progress Notes (Signed)
[name] June 15, 1980 [id number]   Preoperative consult  Chief complaint: Heavy irregular bleeding  History of present illness: 36 y.o. [id number] with long history of heavy irregular bleeding. Status post BTL in the past. Has tried various oral contraceptives without success for menstrual regulation. Has also tried [place] and Mirena IUD without success. Sonohysterogram is negative with negative endometrial biopsy. Exam shows significant retroversion which was felt to potentially make endometrial ablation difficult. Currently is on Megace 40 mg daily to control her bleeding. Also has past history of endometriosis on laparoscopy. Options for management reviewed and patient wants to proceed with definitive hysterectomy. She is admitted for laparoscopic-assisted vaginal hysterectomy with bilateral salpingectomy. Patient does have a history of maternal breast cancer reported when her mother was in her 30s. She has no contact with her mother and is unknown whether her mother was genetically tested. Genetic testing was offered to the patient and the patient declined. She understands that if genetically positive she could have up to an 80% lifetime risk of breast cancer and 40% risk of ovarian cancer. Patient does not want her ovaries removed clearly understanding the situation but prefers ongoing hormonal production and accepts the risk of ovarian cancer in the future. She does agree to a prophylactic salpingectomy as recommended by [facility].  Past medical history,surgical history, medications, allergies, family history and social history were all reviewed and documented in the EPIC chart.  ROS:  Was performed and pertinent positives and negatives are included in the history of present illness.  Exam:  Kennon Portela assistant Vitals:   04/19/16 1459  BP: 120/70   General: well developed, well nourished female, no acute distress HEENT: normal  Lungs: clear to auscultation without wheezing, rales or rhonchi   Cardiac: regular rate without rubs, murmurs or gallops  Abdomen: soft, nontender without masses, guarding, rebound, organomegaly  Pelvic: external bus vagina: normal   Cervix: grossly normal  Uterus: Retroverted, normal size, midline and mobile, nontender  Adnexa: without masses or tenderness    Assessment/Plan:  36 y.o. [name] with history of heavy irregular bleeding refractile to hormonal manipulation, Lysteda, Mirena IUD. Does not want to try endometrial ablation. Wants to proceed with definitive surgery to include LAVH, bilateral salpingectomies. The absolute and irreversible sterility associated with hysterectomy was reviewed understood and accepted. Sexuality following hysterectomy was also discussed the potential for persistent dyspareunia as well as orgasmic dysfunction following the procedure was also reviewed understood and accepted. The ovarian conservation issue was discussed in light of her mother's history of breast cancer. Again she declines genetic counseling and genetic testing. She strongly desires to keep both ovaries realistically understanding the issues and accepting the risks of ovarian cancer in the future. She does give me permission to remove one or both ovaries at the time of surgery if it is my best intraoperative recommendation then would consider hormone replacement afterwards.  The expected intraoperative and postoperative courses as well as the recovery period were reviewed. The risks of infection, prolonged antibiotics, reoperation for abscess or hematoma formation was discussed. The risks of hemorrhage necessitating transfusion and the risks of transfusion reaction, hepatitis, HIV, mad cow disease and other unknown entities was also discussed. Incisional complications to include opening and draining of incisions and closure by secondary intention, dehiscence and long-term issues of keloid/cosmetics and hernia formation were reviewed. The risk of inadvertent injury to  internal organs including bowel, bladder, ureters, vessels, nerves either immediately recognized or delay recognized necessitating major exploratory reparative surgeries and future reparative surgeries  including bowel resection, ostomy formation, bladder repair, ureteral damage repair was discussed with her. The patient's questions were answered to her satisfaction and she is ready to proceed with surgery.      Dara LordsFONTAINE,Carly Sabo P MD, 3:37 PM 04/19/2016

## 2016-04-19 NOTE — H&P (Signed)
[name] 19-Mar-1980 [id number]   History and Physical  Chief complaint: Heavy irregular bleeding  History of present illness: 36 y.o. [id number] with long history of heavy irregular bleeding. Status post BTL in the past. Has tried various oral contraceptives without success for menstrual regulation. Has also tried [place] and Mirena IUD without success. Sonohysterogram is negative with negative endometrial biopsy. Exam shows significant retroversion which was felt to potentially make endometrial ablation difficult. Currently is on Megace 40 mg daily to control her bleeding. Also has past history of endometriosis on laparoscopy. Options for management reviewed and patient wants to proceed with definitive hysterectomy. She is admitted for laparoscopic-assisted vaginal hysterectomy with bilateral salpingectomy. Patient does have a history of maternal breast cancer reported when her mother was in her 30s. She has no contact with her mother and is unknown whether her mother was genetically tested. Genetic testing was offered to the patient and the patient declined. She understands that if genetically positive she could have up to an 80% lifetime risk of breast cancer and 40% risk of ovarian cancer. Patient does not want her ovaries removed clearly understanding the situation but prefers ongoing hormonal production and accepts the risk of ovarian cancer in the future. She does agree to a prophylactic salpingectomy as recommended by [facility].  Past medical history,surgical history, medications, allergies, family history and social history were all reviewed and documented in the EPIC chart.  ROS:  Was performed and pertinent positives and negatives are included in the history of present illness.  Exam:  Kennon Portela assistant 04/19/2016  04/19/16 1459  BP: 120/70   General: well developed, well nourished female, no acute distress HEENT: normal  Lungs: clear to auscultation without wheezing, rales or rhonchi   Cardiac: regular rate without rubs, murmurs or gallops  Abdomen: soft, nontender without masses, guarding, rebound, organomegaly  Pelvic: external bus vagina: normal   Cervix: grossly normal  Uterus: Retroverted, normal size, midline and mobile, nontender  Adnexa: without masses or tenderness    Assessment/Plan:  36 y.o. [name] with history of heavy irregular bleeding refractile to hormonal manipulation, Lysteda, Mirena IUD. Does not want to try endometrial ablation. Wants to proceed with definitive surgery to include LAVH, bilateral salpingectomies. The absolute and irreversible sterility associated with hysterectomy was reviewed understood and accepted. Sexuality following hysterectomy was also discussed the potential for persistent dyspareunia as well as orgasmic dysfunction following the procedure was also reviewed understood and accepted. The ovarian conservation issue was discussed in light of her mother's history of breast cancer. Again she declines genetic counseling and genetic testing. She strongly desires to keep both ovaries realistically understanding the issues and accepting the risks of ovarian cancer in the future. She does give me permission to remove one or both ovaries at the time of surgery if it is my best intraoperative recommendation then would consider hormone replacement afterwards.  The expected intraoperative and postoperative courses as well as the recovery period were reviewed. The risks of infection, prolonged antibiotics, reoperation for abscess or hematoma formation was discussed. The risks of hemorrhage necessitating transfusion and the risks of transfusion reaction, hepatitis, HIV, mad cow disease and other unknown entities was also discussed. Incisional complications to include opening and draining of incisions and closure by secondary intention, dehiscence and long-term issues of keloid/cosmetics and hernia formation were reviewed. The risk of inadvertent injury to  internal organs including bowel, bladder, ureters, vessels, nerves either immediately recognized or delay recognized necessitating major exploratory reparative surgeries and future reparative surgeries  including bowel resection, ostomy formation, bladder repair, ureteral damage repair was discussed with her. The patient's questions were answered to her satisfaction and she is ready to proceed with surgery. She is    Dara LordsFONTAINE,[name] P MD, 3:46 PM 04/19/2016

## 2016-04-19 NOTE — Patient Instructions (Signed)
Followup for surgery as scheduled. 

## 2016-04-21 NOTE — Patient Instructions (Signed)
Your procedure is scheduled on:  Tuesday, April 26, 2016  [facility]  [facility]30[facility]  Pick up the phone at the desk and dial [phone].  Call this number if you have problems the morning of surgery: [phone].  Remember: Do NOT eat food or drink after:  Midnight Monday  Take these medicines the morning of surgery with a SIP OF WATER:  None  Bring Asthma Inhaler day of surgery  Stop ALL herbal medications at this time  Do NOT smoke the day of surgery.  Do NOT wear jewelry (body piercing), metal hair clips/bobby pins, make-up, or nail polish. Do NOT wear lotions, powders, or perfumes.  You may wear deodorant. Do NOT shave for 48 hours prior to surgery. Do NOT bring valuables to the hospital. Contacts, dentures, or bridgework may not be worn into surgery.  Leave suitcase in car.  After surgery it may be brought to your room.  For patients admitted to the hospital, checkout time is 11:00 AM the day of discharge.   Bring a copy of your healthcare power of attorney and living will documents.  **Effective Friday, Jan. 12, 2018, [place] [facility] will implement no hospital visitations from children age 36 and younger due to a steady increase in flu activity in our community and hospitals. **

## 2016-04-22 ENCOUNTER — Inpatient Hospital Stay (HOSPITAL_COMMUNITY)
Admission: RE | Admit: 2016-04-22 | Discharge: 2016-04-22 | Disposition: A | Discharge status: Home / Self Care | Payer: BLUE CROSS/BLUE SHIELD | Source: Ambulatory Visit

## 2016-04-22 ENCOUNTER — Encounter (HOSPITAL_COMMUNITY): Payer: Self-pay

## 2016-04-22 HISTORY — DX: Other complications of anesthesia, initial encounter: T88.59XA

## 2016-04-22 HISTORY — DX: Headache, unspecified: R51.9

## 2016-04-22 HISTORY — DX: Unspecified osteoarthritis, unspecified site: M19.90

## 2016-04-22 HISTORY — DX: Gastro-esophageal reflux disease without esophagitis: K21.9

## 2016-04-22 HISTORY — DX: Fracture of unspecified phalanx of right thumb, initial encounter for closed fracture: S62.501A

## 2016-04-22 HISTORY — DX: Anemia, unspecified: D64.9

## 2016-04-22 HISTORY — DX: Other seasonal allergic rhinitis: J30.2

## 2016-04-22 HISTORY — DX: Headache: R51

## 2016-04-22 HISTORY — DX: Adverse effect of unspecified anesthetic, initial encounter: T41.45XA

## 2016-04-22 HISTORY — DX: Pneumonia, unspecified organism: J18.9

## 2016-04-25 MED ORDER — CEFOTETAN DISODIUM 2 G IJ SOLR
2.0000 g | INTRAMUSCULAR | Status: AC
  Administered 2016-04-26: 2 g via INTRAVENOUS
  Filled 2016-04-25: qty 2

## 2016-04-26 ENCOUNTER — Ambulatory Visit (HOSPITAL_COMMUNITY): Payer: BLUE CROSS/BLUE SHIELD | Admitting: Anesthesiology

## 2016-04-26 ENCOUNTER — Observation Stay (HOSPITAL_COMMUNITY)
Admission: RE | Admit: 2016-04-26 | Discharge: 2016-04-27 | Disposition: A | Discharge status: Home / Self Care | Payer: BLUE CROSS/BLUE SHIELD | Source: Ambulatory Visit | Attending: Gynecology | Admitting: Gynecology

## 2016-04-26 ENCOUNTER — Encounter (HOSPITAL_COMMUNITY)
Admission: RE | Disposition: A | Discharge status: Home / Self Care | Payer: Self-pay | Source: Ambulatory Visit | Attending: Gynecology

## 2016-04-26 ENCOUNTER — Encounter (HOSPITAL_COMMUNITY): Payer: Self-pay | Admitting: Anesthesiology

## 2016-04-26 DIAGNOSIS — N920 Excessive and frequent menstruation with regular cycle: Secondary | ICD-10-CM | POA: Diagnosis not present

## 2016-04-26 DIAGNOSIS — N803 Endometriosis of pelvic peritoneum: Secondary | ICD-10-CM | POA: Diagnosis not present

## 2016-04-26 DIAGNOSIS — N800 Endometriosis of uterus: Secondary | ICD-10-CM | POA: Diagnosis not present

## 2016-04-26 DIAGNOSIS — N921 Excessive and frequent menstruation with irregular cycle: Secondary | ICD-10-CM | POA: Diagnosis not present

## 2016-04-26 DIAGNOSIS — N809 Endometriosis, unspecified: Secondary | ICD-10-CM | POA: Diagnosis not present

## 2016-04-26 DIAGNOSIS — N3281 Overactive bladder: Secondary | ICD-10-CM | POA: Diagnosis not present

## 2016-04-26 DIAGNOSIS — Z9851 Tubal ligation status: Secondary | ICD-10-CM | POA: Insufficient documentation

## 2016-04-26 DIAGNOSIS — Z803 Family history of malignant neoplasm of breast: Secondary | ICD-10-CM | POA: Insufficient documentation

## 2016-04-26 DIAGNOSIS — N926 Irregular menstruation, unspecified: Secondary | ICD-10-CM | POA: Diagnosis not present

## 2016-04-26 HISTORY — PX: LAPAROSCOPIC VAGINAL HYSTERECTOMY WITH SALPINGECTOMY: SHX6680

## 2016-04-26 HISTORY — PX: CYSTOSCOPY: SHX5120

## 2016-04-26 LAB — CBC
HEMATOCRIT: 39 % (ref 36.0–46.0)
Hemoglobin: 12.5 g/dL (ref 12.0–15.0)
MCH: 26.4 pg (ref 26.0–34.0)
MCHC: 32.1 g/dL (ref 30.0–36.0)
MCV: 82.5 fL (ref 78.0–100.0)
PLATELETS: 359 10*3/uL (ref 150–400)
RBC: 4.73 MIL/uL (ref 3.87–5.11)
RDW: 13.7 % (ref 11.5–15.5)
WBC: 8.1 10*3/uL (ref 4.0–10.5)

## 2016-04-26 LAB — COMPREHENSIVE METABOLIC PANEL
ALT: 19 U/L (ref 14–54)
ANION GAP: 5 (ref 5–15)
AST: 20 U/L (ref 15–41)
Albumin: 4.7 g/dL (ref 3.5–5.0)
Alkaline Phosphatase: 93 U/L (ref 38–126)
BILIRUBIN TOTAL: 0.8 mg/dL (ref 0.3–1.2)
BUN: 13 mg/dL (ref 6–20)
CHLORIDE: 109 mmol/L (ref 101–111)
CO2: 25 mmol/L (ref 22–32)
Calcium: 9.2 mg/dL (ref 8.9–10.3)
Creatinine, Ser: 0.66 mg/dL (ref 0.44–1.00)
GFR calc Af Amer: >60 mL/min (ref >60)
Glucose, Bld: 92 mg/dL (ref 65–99)
POTASSIUM: 3.7 mmol/L (ref 3.5–5.1)
Sodium: 139 mmol/L (ref 135–145)
TOTAL PROTEIN: 8.4 g/dL — AB (ref 6.5–8.1)

## 2016-04-26 LAB — HCG, SERUM, QUALITATIVE: PREG SERUM: NEGATIVE

## 2016-04-26 SURGERY — HYSTERECTOMY, VAGINAL, LAPAROSCOPY-ASSISTED, WITH SALPINGECTOMY
Anesthesia: General | Site: Urethra

## 2016-04-26 MED ORDER — LACTATED RINGERS IV SOLN
INTRAVENOUS | Status: DC
  Administered 2016-04-26: 11:00:00 via INTRAVENOUS
  Administered 2016-04-26: 125 mL/h via INTRAVENOUS

## 2016-04-26 MED ORDER — LACTATED RINGERS IR SOLN
Status: DC | PRN
  Administered 2016-04-26: 3000 mL

## 2016-04-26 MED ORDER — BUPIVACAINE HCL (PF) 0.25 % IJ SOLN
INTRAMUSCULAR | Status: AC
  Filled 2016-04-26: qty 30

## 2016-04-26 MED ORDER — SCOPOLAMINE 1 MG/3DAYS TD PT72
1.0000 | MEDICATED_PATCH | Freq: Once | TRANSDERMAL | Status: DC
  Administered 2016-04-26: 1.5 mg via TRANSDERMAL

## 2016-04-26 MED ORDER — MIDAZOLAM HCL 2 MG/2ML IJ SOLN
INTRAMUSCULAR | Status: DC | PRN
  Administered 2016-04-26: 1 mg via INTRAVENOUS

## 2016-04-26 MED ORDER — ROCURONIUM BROMIDE 100 MG/10ML IV SOLN
INTRAVENOUS | Status: DC | PRN
  Administered 2016-04-26: 50 mg via INTRAVENOUS

## 2016-04-26 MED ORDER — KETOROLAC TROMETHAMINE 30 MG/ML IJ SOLN: 30.0000 mg | Freq: Four times a day (QID) | INTRAMUSCULAR | Status: DC

## 2016-04-26 MED ORDER — SUGAMMADEX SODIUM 200 MG/2ML IV SOLN
INTRAVENOUS | Status: AC
  Filled 2016-04-26: qty 2

## 2016-04-26 MED ORDER — PROPOFOL 10 MG/ML IV BOLUS
INTRAVENOUS | Status: DC | PRN
  Administered 2016-04-26: 180 mg via INTRAVENOUS

## 2016-04-26 MED ORDER — ONDANSETRON HCL 4 MG/2ML IJ SOLN
INTRAMUSCULAR | Status: AC
  Filled 2016-04-26: qty 2

## 2016-04-26 MED ORDER — SCOPOLAMINE 1 MG/3DAYS TD PT72
MEDICATED_PATCH | TRANSDERMAL | Status: AC
  Administered 2016-04-26: 1.5 mg via TRANSDERMAL
  Filled 2016-04-26: qty 1

## 2016-04-26 MED ORDER — BUPIVACAINE HCL (PF) 0.25 % IJ SOLN
INTRAMUSCULAR | Status: DC | PRN
  Administered 2016-04-26: 8 mL

## 2016-04-26 MED ORDER — FENTANYL CITRATE (PF) 100 MCG/2ML IJ SOLN
25.0000 ug | INTRAMUSCULAR | Status: DC | PRN
  Administered 2016-04-26: 50 ug via INTRAVENOUS
  Administered 2016-04-26 (×2): 25 ug via INTRAVENOUS

## 2016-04-26 MED ORDER — ONDANSETRON HCL 4 MG/2ML IJ SOLN
INTRAMUSCULAR | Status: DC | PRN
  Administered 2016-04-26: 4 mg via INTRAVENOUS

## 2016-04-26 MED ORDER — ONDANSETRON HCL 4 MG/2ML IJ SOLN: 4.0000 mg | Freq: Once | INTRAMUSCULAR | Status: DC | PRN

## 2016-04-26 MED ORDER — DEXAMETHASONE SODIUM PHOSPHATE 4 MG/ML IJ SOLN
INTRAMUSCULAR | Status: AC
  Filled 2016-04-26: qty 1

## 2016-04-26 MED ORDER — DEXTROSE-NACL 5-0.9 % IV SOLN: INTRAVENOUS | Status: DC

## 2016-04-26 MED ORDER — KETOROLAC TROMETHAMINE 30 MG/ML IJ SOLN
30.0000 mg | Freq: Four times a day (QID) | INTRAMUSCULAR | Status: DC
  Administered 2016-04-26 – 2016-04-27 (×3): 30 mg via INTRAVENOUS
  Filled 2016-04-26 (×3): qty 1

## 2016-04-26 MED ORDER — MIDAZOLAM HCL 2 MG/2ML IJ SOLN
INTRAMUSCULAR | Status: AC
Start: 2016-04-26 — End: 2016-04-26
  Filled 2016-04-26: qty 2

## 2016-04-26 MED ORDER — FENTANYL CITRATE (PF) 250 MCG/5ML IJ SOLN
INTRAMUSCULAR | Status: AC
  Filled 2016-04-26: qty 5

## 2016-04-26 MED ORDER — DEXAMETHASONE SODIUM PHOSPHATE 10 MG/ML IJ SOLN
INTRAMUSCULAR | Status: DC | PRN
  Administered 2016-04-26: 4 mg via INTRAVENOUS

## 2016-04-26 MED ORDER — SUGAMMADEX SODIUM 200 MG/2ML IV SOLN
INTRAVENOUS | Status: DC | PRN
  Administered 2016-04-26: 145.4 mg via INTRAVENOUS

## 2016-04-26 MED ORDER — FENTANYL CITRATE (PF) 100 MCG/2ML IJ SOLN
INTRAMUSCULAR | Status: DC | PRN
  Administered 2016-04-26: 50 ug via INTRAVENOUS
  Administered 2016-04-26 (×2): 25 ug via INTRAVENOUS
  Administered 2016-04-26 (×3): 50 ug via INTRAVENOUS

## 2016-04-26 MED ORDER — KETOROLAC TROMETHAMINE 30 MG/ML IJ SOLN
INTRAMUSCULAR | Status: AC
  Filled 2016-04-26: qty 1

## 2016-04-26 MED ORDER — LIDOCAINE HCL (CARDIAC) 20 MG/ML IV SOLN
INTRAVENOUS | Status: AC
  Filled 2016-04-26: qty 5

## 2016-04-26 MED ORDER — PROPOFOL 10 MG/ML IV BOLUS
INTRAVENOUS | Status: AC
  Filled 2016-04-26: qty 20

## 2016-04-26 MED ORDER — FENTANYL CITRATE (PF) 100 MCG/2ML IJ SOLN
INTRAMUSCULAR | Status: AC
  Administered 2016-04-26: 50 ug via INTRAVENOUS
  Filled 2016-04-26: qty 2

## 2016-04-26 MED ORDER — OXYCODONE HCL 5 MG/5ML PO SOLN: 5.0000 mg | Freq: Once | ORAL | Status: DC | PRN

## 2016-04-26 MED ORDER — MORPHINE SULFATE (PF) 4 MG/ML IV SOLN
1.0000 mg | INTRAVENOUS | Status: DC | PRN
  Administered 2016-04-26: 2 mg via INTRAVENOUS
  Filled 2016-04-26: qty 1

## 2016-04-26 MED ORDER — LIDOCAINE-EPINEPHRINE 1 %-1:100000 IJ SOLN
INTRAMUSCULAR | Status: AC
  Filled 2016-04-26: qty 1

## 2016-04-26 MED ORDER — KETOROLAC TROMETHAMINE 30 MG/ML IJ SOLN
INTRAMUSCULAR | Status: DC | PRN
Start: 2016-04-26 — End: 2016-04-26
  Administered 2016-04-26: 30 mg via INTRAVENOUS

## 2016-04-26 MED ORDER — OXYCODONE HCL 5 MG PO TABS: 5.0000 mg | ORAL_TABLET | Freq: Once | ORAL | Status: DC | PRN

## 2016-04-26 MED ORDER — DIPHENHYDRAMINE HCL 25 MG PO CAPS: 50.0000 mg | ORAL_CAPSULE | Freq: Four times a day (QID) | ORAL | Status: DC | PRN

## 2016-04-26 MED ORDER — LIDOCAINE-EPINEPHRINE 1 %-1:100000 IJ SOLN
INTRAMUSCULAR | Status: DC | PRN
  Administered 2016-04-26: 15 mL

## 2016-04-26 MED ORDER — LIDOCAINE HCL (CARDIAC) 20 MG/ML IV SOLN
INTRAVENOUS | Status: DC | PRN
  Administered 2016-04-26: 70 mg via INTRAVENOUS

## 2016-04-26 MED ORDER — OXYCODONE-ACETAMINOPHEN 5-325 MG PO TABS
1.0000 | ORAL_TABLET | ORAL | Status: DC | PRN
  Administered 2016-04-26: 1 via ORAL
  Administered 2016-04-26: 2 via ORAL
  Administered 2016-04-27: 1 via ORAL
  Filled 2016-04-26: qty 1
  Filled 2016-04-26: qty 2
  Filled 2016-04-26: qty 1

## 2016-04-26 SURGICAL SUPPLY — 57 items
ADH SKN CLS APL DERMABOND .7 (GAUZE/BANDAGES/DRESSINGS) ×2
APL SRG 38 LTWT LNG FL B (MISCELLANEOUS) ×2
APPLICATOR ARISTA FLEXITIP XL (MISCELLANEOUS) ×1 IMPLANT
CABLE HIGH FREQUENCY MONO STRZ (ELECTRODE) IMPLANT
CLOTH BEACON ORANGE TIMEOUT ST (SAFETY) ×3 IMPLANT
CONT PATH 16OZ SNAP LID 3702 (MISCELLANEOUS) ×3 IMPLANT
COVER BACK TABLE 60X90IN (DRAPES) ×3 IMPLANT
COVER LIGHT HANDLE  1/PK (MISCELLANEOUS) ×1
COVER LIGHT HANDLE 1/PK (MISCELLANEOUS) IMPLANT
COVER MAYO STAND STRL (DRAPES) ×3 IMPLANT
DECANTER SPIKE VIAL GLASS SM (MISCELLANEOUS) ×6 IMPLANT
DERMABOND ADVANCED (GAUZE/BANDAGES/DRESSINGS) ×1
DERMABOND ADVANCED .7 DNX12 (GAUZE/BANDAGES/DRESSINGS) ×2 IMPLANT
DRSG OPSITE POSTOP 3X4 (GAUZE/BANDAGES/DRESSINGS) ×3 IMPLANT
DURAPREP 26ML APPLICATOR (WOUND CARE) ×3 IMPLANT
ELECT REM PT RETURN 9FT ADLT (ELECTROSURGICAL) ×3
ELECTRODE REM PT RTRN 9FT ADLT (ELECTROSURGICAL) ×2 IMPLANT
FILTER SMOKE EVAC LAPAROSHD (FILTER) ×3 IMPLANT
GLOVE BIO SURGEON STRL SZ7.5 (GLOVE) ×9 IMPLANT
GLOVE BIOGEL PI IND STRL 6.5 (GLOVE) ×2 IMPLANT
GLOVE BIOGEL PI IND STRL 7.0 (GLOVE) ×4 IMPLANT
GLOVE BIOGEL PI IND STRL 8 (GLOVE) ×4 IMPLANT
GLOVE BIOGEL PI INDICATOR 6.5 (GLOVE) ×1
GLOVE BIOGEL PI INDICATOR 7.0 (GLOVE) ×2
GLOVE BIOGEL PI INDICATOR 8 (GLOVE) ×2
GLOVE ECLIPSE 7.5 STRL STRAW (GLOVE) ×6 IMPLANT
HEMOSTAT ARISTA ABSORB 3G PWDR (MISCELLANEOUS) ×1 IMPLANT
LEGGING LITHOTOMY PAIR STRL (DRAPES) ×3 IMPLANT
NDL MAYO CATGUT SZ4 TPR NDL (NEEDLE) IMPLANT
NEEDLE MAYO CATGUT SZ4 (NEEDLE) IMPLANT
NS IRRIG 1000ML POUR BTL (IV SOLUTION) ×3 IMPLANT
PACK LAVH (CUSTOM PROCEDURE TRAY) ×3 IMPLANT
PACK ROBOTIC GOWN (GOWN DISPOSABLE) ×3 IMPLANT
PACK TRENDGUARD 450 HYBRID PRO (MISCELLANEOUS) IMPLANT
PACK TRENDGUARD 600 HYBRD PROC (MISCELLANEOUS) IMPLANT
PAD OB MATERNITY 4.3X12.25 (PERSONAL CARE ITEMS) ×3 IMPLANT
POUCH LAPAROSCOPIC INSTRUMENT (MISCELLANEOUS) ×6 IMPLANT
PROTECTOR NERVE ULNAR (MISCELLANEOUS) ×6 IMPLANT
SCISSORS LAP 5X35 DISP (ENDOMECHANICALS) IMPLANT
SET CYSTO W/LG BORE CLAMP LF (SET/KITS/TRAYS/PACK) ×3 IMPLANT
SET IRRIG TUBING LAPAROSCOPIC (IRRIGATION / IRRIGATOR) ×3 IMPLANT
SHEARS HARMONIC ACE PLUS 36CM (ENDOMECHANICALS) ×3 IMPLANT
SLEEVE XCEL OPT CAN 5 100 (ENDOMECHANICALS) ×3 IMPLANT
SUT PLAIN 4 0 FS 2 27 (SUTURE) ×3 IMPLANT
SUT VIC AB 0 CT1 18XCR BRD8 (SUTURE) ×4 IMPLANT
SUT VIC AB 0 CT1 36 (SUTURE) ×3 IMPLANT
SUT VIC AB 0 CT1 8-18 (SUTURE) ×6
SUT VICRYL 0 TIES 12 18 (SUTURE) ×3 IMPLANT
SUT VICRYL 0 UR6 27IN ABS (SUTURE) ×4 IMPLANT
SYR BULB IRRIGATION 50ML (SYRINGE) ×4 IMPLANT
TOWEL OR 17X24 6PK STRL BLUE (TOWEL DISPOSABLE) ×6 IMPLANT
TRAY FOLEY CATH SILVER 14FR (SET/KITS/TRAYS/PACK) ×3 IMPLANT
TRENDGUARD 450 HYBRID PRO PACK (MISCELLANEOUS)
TRENDGUARD 600 HYBRID PROC PK (MISCELLANEOUS)
TROCAR XCEL NON-BLD 11X100MML (ENDOMECHANICALS) ×3 IMPLANT
TROCAR XCEL NON-BLD 5MMX100MML (ENDOMECHANICALS) ×3 IMPLANT
WARMER LAPAROSCOPE (MISCELLANEOUS) ×3 IMPLANT

## 2016-04-26 NOTE — OR Nursing (Signed)
Specimen timeout completed with surgeon and surgical staff. 

## 2016-04-26 NOTE — Anesthesia Procedure Notes (Signed)
Procedure Name: Intubation Date/Time: 04/26/2016 9:50 AM Performed by: Tobin Chad Pre-anesthesia Checklist: Patient identified, Emergency Drugs available, Suction available and Patient being monitored Patient Re-evaluated:Patient Re-evaluated prior to inductionOxygen Delivery Method: Circle system utilized and Simple face mask Preoxygenation: Pre-oxygenation with 100% oxygen Intubation Type: IV induction Ventilation: Mask ventilation without difficulty Laryngoscope Size: Mac and 3 Grade View: Grade II Tube type: Oral Tube size: 7.0 mm Number of attempts: 1 Airway Equipment and Method: Stylet Placement Confirmation: ETT inserted through vocal cords under direct vision,  positive ETCO2 and breath sounds checked- equal and bilateral Secured at: 21 cm Tube secured with: Tape Dental Injury: Teeth and Oropharynx as per pre-operative assessment

## 2016-04-26 NOTE — Anesthesia Postprocedure Evaluation (Signed)
Anesthesia Post Note  Patient: [name]  Procedure(s) Performed: Procedure(s) (LRB): LAPAROSCOPIC ASSISTED VAGINAL HYSTERECTOMY WITH SALPINGECTOMY (Bilateral) CYSTOSCOPY (N/A)  Patient location during evaluation: PACU Anesthesia Type: General Level of consciousness: awake, awake and alert and oriented Pain management: pain level controlled Vital Signs Assessment: post-procedure vital signs reviewed and stable Respiratory status: spontaneous breathing, nonlabored ventilation and respiratory function stable Cardiovascular status: blood pressure returned to baseline Anesthetic complications: no       Last Vitals:  Vitals:   04/26/16 1557 04/26/16 2015  BP: (!) 98/59 (!) 104/57  Pulse: (!) 59 87  Resp: 16 16  Temp: 36.9 C 37.1 C    Last Pain:  Vitals:   04/26/16 2015  TempSrc: Oral  PainSc:                  Kylani Wires COKER

## 2016-04-26 NOTE — OR Nursing (Signed)
Arista applied in pelvic cavity by Dr. Audie BoxFontaine

## 2016-04-26 NOTE — H&P (Signed)
The patient was examined.  I reviewed the proposed surgery and consent form with the patient.  The dictated history and physical is current and accurate and all questions were answered. The patient is ready to proceed with surgery and has a realistic understanding and expectation for the outcome.   Dara LordsFONTAINE,TIMOTHY P MD, 9:11 AM 04/26/2016

## 2016-04-26 NOTE — Anesthesia Preprocedure Evaluation (Addendum)

## 2016-04-26 NOTE — Progress Notes (Signed)
In to see patient. Doing well with minimal discomfort. Abdominal incisions all intact. Abdomen is soft with minimal tenderness. Abundant urine within the Foley bag. Results of surgery reviewed with the patient. We'll continue with routine postoperative care with anticipated discharge tomorrow morning.

## 2016-04-26 NOTE — Transfer of Care (Signed)
Immediate Anesthesia Transfer of Care Note  Patient: [name]  Procedure(s) Performed: Procedure(s) with comments: LAPAROSCOPIC ASSISTED VAGINAL HYSTERECTOMY WITH SALPINGECTOMY (Bilateral) - request 7:30am OR time  Requests 2 1/2 hours OR time CYSTOSCOPY (N/A)  Patient Location: PACU  Anesthesia Type:General  Level of Consciousness: awake, oriented, sedated and patient cooperative  Airway & Oxygen Therapy: Patient Spontanous Breathing and Patient connected to nasal cannula oxygen  Post-op Assessment: Report given to RN and Post -op Vital signs reviewed and stable  Post vital signs: Reviewed and stable  Last Vitals:  Vitals:   04/26/16 0902  BP: 119/87  Resp: 16  Temp: 37.3 C    Last Pain:  Vitals:   04/26/16 0902  TempSrc: Oral      Patients Stated Pain Goal: 4 (04/26/16 0902)  Complications: No apparent anesthesia complications

## 2016-04-26 NOTE — Op Note (Signed)
[name] 01-13-1981 [id number]   Post Operative Note   Date of surgery:  04/26/2016  Pre Op Dx:  Refractile heavy irregular bleeding, history of endometriosis  Post Op Dx:  History of heavy irregular bleeding, endometriosis  Procedure:  Laparoscopic-assisted vaginal hysterectomy, bilateral salpingectomies, fulguration endometriosis, cystoscopy  Surgeon:  [name]  Assistant:  Reynaldo Minium  Anesthesia:  General  EBL:  150 cc anesthesia reported  Complications:  None  Specimen:  Uterus with attached fallopian tubes bilaterally to pathology  Findings: EUA:  External BUS vagina normal. Cervix normal. Uterus retroverted grossly normal size midline mobile. Adnexa without gross masses   Operative:  Anterior cul-de-sac with several small classic appearing endometriotic powder burn implants, fulgurated superficially. Posterior cul-de-sac with several classic appearing endometriotic powder burn implants, fulgurated superficially. Uterus grossly normal in size, boggy to palpation. And left fallopian tubes with evidence of prior Falope ring tubal sterilization. Otherwise grossly normal. Right and left ovaries grossly normal, free and mobile. Upper abdominal exam shows liver smooth with no abnormalities. Gallbladder grossly normal to limited inspection. Appendix not visualized. No upper abdominal pathology noted.   Cystoscopy: With normal-appearing bladder mucosa and no evidence of trauma. Bilateral ureteral jets noted, the patient having previously received Pyridium preoperatively.  Procedure:  The patient was taken to the operating room, placed in the low dorsal lithotomy position, received an abdominal, perineal and vaginal antiseptic preparation, the EUA was performed and the Foley catheter was placed in sterile technique. The timeout was performed by the surgical team. The cervix was subsequently visualized with a speculum, anterior lip grasped with a single-tooth tenaculum and  a Hulka tenaculum was placed without difficulty. The patient was draped in the usual fashion and a repeat transverse infraumbilical incision was made excising the old scar at this time. Using the 10 mm direct entry type laparoscopic trocar the abdomen was directly entered under direct visualization without difficulty and subsequently insufflated. Right and left 5 mm ports were then placed under direct visualization after transillumination for the vessels without difficulty. Examination of the pelvic organs and upper abdominal exam was carried out with findings noted above. The uterus was elevated and the left fallopian tube was elevated exposing the mesosalpinx, the ureter was identified away from surgical site and using the harmonic scalpel the mesosalpinx was transected to the level of the uterus. The uterine ovarian pedicle was then transected with the harmonic scalpel as was the round ligament and the parametrial peritoneal reflection was transected to the level of the uterine artery, leaving it intact.. The vesicouterine peritoneal fold was then transected to the midline without difficulty. A similar procedure was carried out on the other side meeting the vesicouterine peritoneal incision in the midline. Attention was then turned to the vaginal portion of the procedure and the patient was placed in the high dorsal lithotomy position, the cervix visualized with a weighted speculum, the Hulka tenaculum removed and the cervix were grasped with a tenaculum. The cervical mucosa was circumferentially injected using 1% lidocaine with 1:100,000 epinephrine dilution, 15 cc total. The cervical mucosa was then circumferentially incised and the paracervical planes were developed. The vesicouterine plane was sharply and bluntly developed. The  Cervix was elevated and the bulging posterior cul-de-sac was sharply entered without difficulty and a long weighted speculum was placed. Right and left uterosacral ligaments were  identified, clamped, cut and ligated using 0 Vicryl suture and tagged for future reference. The anterior vesicouterine plane was progressively developed and the anterior cul-de-sac was ultimately  entered without difficulty. The uterus was progressively freed from its attachments through clamping cutting and ligating of the paracervical and parametrial tissues noting right and left uterine vessels were identified and ligated separately. The uterus was then delivered through the vagina and the remaining parametrial attachments were clamped, cut and ligated using 0 Vicryl suture and the specimen was sent to pathology. The long weighted speculum was replaced with a shorter weighted speculum and the intestines were packed from the cul-de-sac using a tagged tail sponge. The posterior vaginal cuff was run from uterosacral ligament to uterosacral ligament using 0 Vicryl suture in running interlocking stitch. The pelvis was irrigated showing adequate hemostasis, the bowel packing removed and the vagina was closed anterior to posterior using 0 Vicryl suture in interrupted figure-of-eight stitch. The vagina was irrigated showing adequate hemostasis and the weighted speculum was removed.  Attention was then turned to the cystoscopy. The Foley catheter was removed and cystoscopy was performed without difficulty showing no evidence of bladder mucosal damage and bilateral ureteral jets. The Foley catheter was replaced, the patient placed in the low dorsal lithotomy position, the surgical team regloved and regowned and the abdomen was reinsufflated. The pelvis was inspected and several small bleeding points were addressed using bipolar cautery superficially. Arista hemostatic agent was applied to the cul-de-sac surgical site, the gas allowed to escape and reinspection of the cul-de-sac under low pressure situation showed continued hemostasis. The right and left 5 mm ports were backed out under direct visualization showing adequate  hemostasis. The infraumbilical port was then backed out under direct visualization after evacuation of the abdominal gas with good hemostasis and no evidence of hernia formation. The infraumbilical port was closed using 0 Vicryl suture in an interrupted subcutaneous fashion stitch and the skin reapproximated using 4-0 plain suture in a running subcuticular stitch. The 5 mm skin incisions were closed using LiquiBand skin adhesive which was also applied over the infraumbilical site. All skin incisions were injected subcutaneously with 0.25% Marcaine a total of 8 cc. The instrument, sponge and needle count were verified correct as was the specimen identified and the procedure performed. The patient received intraoperative Toradol, was awakened without difficulty and was taken to recovery room in good condition having tolerated the procedure well.       Dara LordsFONTAINE,TIMOTHY P MD, 12:08 PM 04/26/2016

## 2016-04-27 ENCOUNTER — Encounter (HOSPITAL_COMMUNITY): Payer: Self-pay | Admitting: Gynecology

## 2016-04-27 DIAGNOSIS — N920 Excessive and frequent menstruation with regular cycle: Secondary | ICD-10-CM | POA: Diagnosis not present

## 2016-04-27 DIAGNOSIS — Z803 Family history of malignant neoplasm of breast: Secondary | ICD-10-CM | POA: Diagnosis not present

## 2016-04-27 DIAGNOSIS — N800 Endometriosis of uterus: Secondary | ICD-10-CM | POA: Diagnosis not present

## 2016-04-27 DIAGNOSIS — Z9851 Tubal ligation status: Secondary | ICD-10-CM | POA: Diagnosis not present

## 2016-04-27 DIAGNOSIS — N803 Endometriosis of pelvic peritoneum: Secondary | ICD-10-CM | POA: Diagnosis not present

## 2016-04-27 MED ORDER — OXYCODONE-ACETAMINOPHEN 5-325 MG PO TABS: 1.0000 | ORAL_TABLET | ORAL | 0 refills | Status: DC | PRN

## 2016-04-27 NOTE — Discharge Instructions (Signed)
°  Postoperative Instructions Hysterectomy ° °Dr. Emelynn Rance and the nursing staff have discussed postoperative instructions with you.  If you have any questions please ask them before you leave the hospital, or call Dr Yosgart Pavey’s office at [phone].   ° °We would like to emphasize the following instructions: ° ° °  Call the office to make your follow-up appointment as recommended by Dr Yaelis Scharfenberg (usually 2 weeks). ° °  You were given a prescription, or one was ordered for you at the pharmacy you designated.  Get that prescription filled and take the medication according to instructions. ° °  You may eat a regular diet, but slowly until you start having bowel movements. ° °  Drink plenty of water daily. ° °  Nothing in the vagina (intercourse, douching, objects of any kind) until released by Dr Kemonie Cutillo. ° °  No driving for two weeks.  Wait to be cleared by Dr Clifford Coudriet at your first post op check.  Car rides (short) are ok after several days at home, as long as you are not having significant pain, but no traveling out of town. ° °  You may shower, but no baths.  Walking up and down stairs is ok.  No heavy lifting, prolonged standing, repeated bending or any “working out” until your first post op check. ° °  Rest frequently, listen to your body and do not push yourself and overdo it. ° °  Call if: ° °o Your pain medication does not seem strong enough. °o Worsening pain or abdominal bloating °o Persistent nausea or vomiting °o Difficulty with urination or bowel movements. °o Temperature of 101 degrees or higher. °o Bleeding heavier then staining (clots or period type flow). °o Incisions become red, tender or begin to drain. °o You have any questions or concerns. °

## 2016-04-27 NOTE — Progress Notes (Signed)
Pt discharged to home with husband.  Condition stable.  Discharge instructions reviewed with pt and husband together.  Percocet prescription given directly to pt by Dr. Audie BoxFontaine.  Pt to car via wheelchair with RN.  No equipment for home ordered at discharge.

## 2016-04-27 NOTE — Discharge Summary (Signed)
Vernelle EmeraldSasha T Polzin 06/07/1980 [id number]   Discharge Summary  Date of Admission:  04/26/2016  Date of Discharge:  04/27/2016  Discharge Diagnosis:  Heavy irregular vaginal bleeding, endometriosis, adenomyosis   Procedure:  Procedure(s): LAPAROSCOPIC ASSISTED VAGINAL HYSTERECTOMY WITH SALPINGECTOMY CYSTOSCOPY  Pathology:  Uterus, cervix and bilateral fallopian tubes - CERVIX: UNREMARKABLE. NO EVIDENCE OF DYSPLASIA. - ENDOMETRIUM: INACTIVE WITH PROGESTATIONAL CHANGES. NO EVIDENCE OF HYPERPLASIA OR MALIGNANCY. - MYOMETRIUM: ADENOMYOSIS. NO EVIDENCE OF MALIGNANCY. - UTERINE SEROSA: UNREMARKABLE. NO ENDOMETRIOSIS OR MALIGNANCY. - RIGHT AND LEFT FALLOPIAN TUBES: UNREMARKABLE. NO ENDOMETRIOSIS OR MALIGNANCY.  Hospital Course:  Patient underwent an uncomplicated LAVH bilateral salpingectomies 04/26/2016. Her postoperative course was uncomplicated and she was discharged on postoperative day #1 ambulating well, tolerating a regular diet, voiding without difficulty with good pain relief on oral medication. The patient received instructions for postoperative care and call precautions.  She received prescriptions per AVS and will be seen in the office 2 weeks following discharge.       Dara LordsFONTAINE,Annalynn Centanni P MD, 1:53 PM 04/27/2016

## 2016-04-27 NOTE — Progress Notes (Signed)
[name] 02/16/1981 [id number]   1 Day Post-Op Procedure(s) (LRB): LAPAROSCOPIC ASSISTED VAGINAL HYSTERECTOMY WITH SALPINGECTOMY (Bilateral) CYSTOSCOPY (N/A)  Subjective: Patient reports has no problems, feels well, pain severity reported mild, Yes.   taking PO, foley catheter out, Yes.   voiding, Yes.   ambulating, Yes.   passing flatus  Objective: Vital signs in last 24 hours: Temp:  [97.7 F (36.5 C)-99.1 F (37.3 C)] 98.9 F (37.2 C) (03/07 0500) Pulse Rate:  [49-87] 63 (03/07 0500) Resp:  [13-20] 18 (03/07 0500) BP: (91-127)/(43-87) 98/60 (03/07 0500) SpO2:  [98 %-100 %] 98 % (03/07 0500) Weight:  [160 lb 6 oz (72.7 kg)] 160 lb 6 oz (72.7 kg) (03/06 0902) Last BM Date: 04/26/16    EXAM General: awake, alert and no distress Resp: clear to auscultation bilaterally Cardio: regular rate and rhythm GI: soft, nontender, bowel sounds active, incisions dry intact Lower Extremities: Without swelling or tenderness Vaginal Bleeding: Reported scant    Assessment: s/p Procedure(s): LAPAROSCOPIC ASSISTED VAGINAL HYSTERECTOMY WITH SALPINGECTOMY CYSTOSCOPY: progressing well, ready for discharge.    Plan: Discharge home today.  Precautions, instructions and follow up were discussed with the patient.  Prescriptions provided per AVS.  Patient to call the office to arrange a post-operative appointmant in 2 weeks.    Dara LordsFONTAINE,Mattheus Rauls P MD, 7:29 AM 04/27/2016

## 2016-05-11 ENCOUNTER — Ambulatory Visit: Payer: BLUE CROSS/BLUE SHIELD | Admitting: Gynecology

## 2016-05-12 ENCOUNTER — Encounter: Payer: Self-pay | Admitting: Gynecology

## 2016-05-12 ENCOUNTER — Ambulatory Visit (INDEPENDENT_AMBULATORY_CARE_PROVIDER_SITE_OTHER): Payer: BLUE CROSS/BLUE SHIELD | Admitting: Gynecology

## 2016-05-12 VITALS — BP 128/76

## 2016-05-12 DIAGNOSIS — Z9889 Other specified postprocedural states: Secondary | ICD-10-CM

## 2016-05-12 DIAGNOSIS — R300 Dysuria: Secondary | ICD-10-CM

## 2016-05-12 NOTE — Patient Instructions (Signed)
Follow up in 2 weeks for postoperative followup

## 2016-05-12 NOTE — Progress Notes (Signed)
    Leah EmeraldSasha T Bruck 11/23/1980 [id number]        36 y.o.  [id number] presents for her postoperative visit status post LAVH, bilateral salpingectomies fulguration of endometriosis. Doing well without complaints.  Past medical history,surgical history, problem list, medications, allergies, family history and social history were all reviewed and documented in the EPIC chart.  Directed ROS with pertinent positives and negatives documented in the history of present illness/assessment and plan.  Exam: Biomedical scientistBlanca assistant Vitals:   05/12/16 1217  BP: 128/76   General appearance:  Normal Abdomen soft nontender without masses guarding rebound. Incisions healing nicely. Pelvic external BUS vagina with cuff suture line intact. Bimanual without masses or tenderness  Assessment/Plan:  36 y.o. [id number] with normal postoperative visit. Reviewed pathology which showed adenomyosis. Reviewed pictures of the surgery which showed several classic appearing endometriotic implants. Patient will slowly resume activities with the exception of pelvic rest and follow up in 2 weeks for her next postoperative visit.    Dara LordsFONTAINE,Shadonna Benedick P MD, 12:34 PM 05/12/2016

## 2016-05-13 LAB — URINALYSIS W MICROSCOPIC + REFLEX CULTURE
BILIRUBIN URINE: NEGATIVE
Casts: NONE SEEN [LPF]
Crystals: NONE SEEN [HPF]
GLUCOSE, UA: NEGATIVE
HGB URINE DIPSTICK: NEGATIVE
KETONES UR: NEGATIVE
LEUKOCYTES UA: NEGATIVE
Nitrite: NEGATIVE
PH: 5.5 (ref 5.0–8.0)
PROTEIN: NEGATIVE
Specific Gravity, Urine: 1.023 (ref 1.001–1.035)
YEAST: NONE SEEN [HPF]

## 2016-05-14 LAB — URINE CULTURE: Organism ID, Bacteria: NO GROWTH

## 2016-05-26 ENCOUNTER — Ambulatory Visit: Payer: BLUE CROSS/BLUE SHIELD | Admitting: Gynecology

## 2016-05-27 ENCOUNTER — Ambulatory Visit (INDEPENDENT_AMBULATORY_CARE_PROVIDER_SITE_OTHER): Payer: BLUE CROSS/BLUE SHIELD | Admitting: Gynecology

## 2016-05-27 VITALS — BP 124/74

## 2016-05-27 DIAGNOSIS — Z09 Encounter for follow-up examination after completed treatment for conditions other than malignant neoplasm: Secondary | ICD-10-CM

## 2016-05-27 NOTE — Progress Notes (Signed)
    [name] [birth year] [id number]        36 y.o.  [id number] presents or her 1 month postop visit status post LAVH bilateral salpingectomies. Doing well. Is having a little bit of nausea over the last several days but thinks that this is cycle time as she feels like this is her "period" week where she does get some nausea. Also having a little bit of diarrhea. No fever or chills. No one else sick in the family. Eating regular diet.  Past medical history,surgical history, problem list, medications, allergies, family history and social history were all reviewed and documented in the EPIC chart.  Directed ROS with pertinent positives and negatives documented in the history of present illness/assessment and plan.  Exam: Kennon Portela assistant Vitals:   05/27/16 1109  BP: 124/74   General appearance:  Normal Abdomen soft nontender without masses guarding rebound. Incisions healed nicely. Pelvic external BUS vagina with cuff healing nicely. By noted without masses or tenderness.  Assessment/Plan:  36 y.o. [id number] with normal postoperative visit. Will slowly resume activities with the exception of continued rest. Will follow nausea for now and represent if it persists. Patient will follow up in 2 weeks for her final postoperative visit.    [name], 11:24 AM 05/27/2016

## 2016-05-27 NOTE — Patient Instructions (Signed)
Follow up 2 weeks for postoperative visit

## 2016-06-10 ENCOUNTER — Ambulatory Visit (INDEPENDENT_AMBULATORY_CARE_PROVIDER_SITE_OTHER): Payer: BLUE CROSS/BLUE SHIELD | Admitting: Gynecology

## 2016-06-10 ENCOUNTER — Encounter: Payer: Self-pay | Admitting: Gynecology

## 2016-06-10 VITALS — BP 118/74

## 2016-06-10 DIAGNOSIS — Z9889 Other specified postprocedural states: Secondary | ICD-10-CM

## 2016-06-10 NOTE — Progress Notes (Signed)
    [name] [birth year] [id number]        36 y.o.  [id number] presents for her 6 week postoperative visit status post LAVH bilateral salpingectomies.    Past medical history,surgical history, problem list, medications, allergies, family history and social history were all reviewed and documented in the EPIC chart.  Directed ROS with pertinent positives and negatives documented in the history of present illness/assessment and plan.  Exam: Kennon Portela assistant Vitals:   06/10/16 0901  BP: 118/74   General appearance:  Normal Abdomen soft nontender without masses guarding rebound. Incisions healed nicely. Pelvic external BUS vagina with cuff healed. Bimanual without masses or tenderness  Assessment/Plan:  36 y.o. [name] with normal postoperative visit status post LAVH, bilateral salpingectomies. Patient will slowly resume all normal activities. Assuming she does well then she will follow up in the fall when she is due for her annual exam, sooner if any issues.    [name], 9:11 AM 06/10/2016

## 2016-06-10 NOTE — Patient Instructions (Signed)
Follow up for annual exam in the Fall

## 2016-07-06 ENCOUNTER — Encounter: Payer: Self-pay | Admitting: Gynecology

## 2016-09-22 DIAGNOSIS — M542 Cervicalgia: Secondary | ICD-10-CM | POA: Diagnosis not present

## 2016-09-22 DIAGNOSIS — M9901 Segmental and somatic dysfunction of cervical region: Secondary | ICD-10-CM | POA: Diagnosis not present

## 2016-09-22 DIAGNOSIS — M9903 Segmental and somatic dysfunction of lumbar region: Secondary | ICD-10-CM | POA: Diagnosis not present

## 2016-09-22 DIAGNOSIS — M545 Low back pain: Secondary | ICD-10-CM | POA: Diagnosis not present

## 2016-10-13 DIAGNOSIS — M9903 Segmental and somatic dysfunction of lumbar region: Secondary | ICD-10-CM | POA: Diagnosis not present

## 2016-10-13 DIAGNOSIS — M542 Cervicalgia: Secondary | ICD-10-CM | POA: Diagnosis not present

## 2016-10-13 DIAGNOSIS — M9901 Segmental and somatic dysfunction of cervical region: Secondary | ICD-10-CM | POA: Diagnosis not present

## 2016-10-13 DIAGNOSIS — M545 Low back pain: Secondary | ICD-10-CM | POA: Diagnosis not present

## 2016-10-19 DIAGNOSIS — M9903 Segmental and somatic dysfunction of lumbar region: Secondary | ICD-10-CM | POA: Diagnosis not present

## 2016-10-19 DIAGNOSIS — M9901 Segmental and somatic dysfunction of cervical region: Secondary | ICD-10-CM | POA: Diagnosis not present

## 2016-10-19 DIAGNOSIS — M542 Cervicalgia: Secondary | ICD-10-CM | POA: Diagnosis not present

## 2016-10-19 DIAGNOSIS — M545 Low back pain: Secondary | ICD-10-CM | POA: Diagnosis not present

## 2016-10-26 DIAGNOSIS — M9901 Segmental and somatic dysfunction of cervical region: Secondary | ICD-10-CM | POA: Diagnosis not present

## 2016-10-26 DIAGNOSIS — M542 Cervicalgia: Secondary | ICD-10-CM | POA: Diagnosis not present

## 2016-10-26 DIAGNOSIS — M9903 Segmental and somatic dysfunction of lumbar region: Secondary | ICD-10-CM | POA: Diagnosis not present

## 2016-10-26 DIAGNOSIS — M545 Low back pain: Secondary | ICD-10-CM | POA: Diagnosis not present

## 2016-11-09 DIAGNOSIS — M542 Cervicalgia: Secondary | ICD-10-CM | POA: Diagnosis not present

## 2016-11-09 DIAGNOSIS — M545 Low back pain: Secondary | ICD-10-CM | POA: Diagnosis not present

## 2016-11-09 DIAGNOSIS — M9901 Segmental and somatic dysfunction of cervical region: Secondary | ICD-10-CM | POA: Diagnosis not present

## 2016-11-09 DIAGNOSIS — M9903 Segmental and somatic dysfunction of lumbar region: Secondary | ICD-10-CM | POA: Diagnosis not present

## 2016-11-28 ENCOUNTER — Ambulatory Visit (INDEPENDENT_AMBULATORY_CARE_PROVIDER_SITE_OTHER): Payer: BLUE CROSS/BLUE SHIELD | Admitting: Gynecology

## 2016-11-28 ENCOUNTER — Encounter: Payer: Self-pay | Admitting: Gynecology

## 2016-11-28 ENCOUNTER — Other Ambulatory Visit: Payer: Self-pay | Admitting: Gynecology

## 2016-11-28 VITALS — BP 118/76 | Ht 63.0 in | Wt 163.0 lb

## 2016-11-28 DIAGNOSIS — Z13220 Encounter for screening for lipoid disorders: Secondary | ICD-10-CM

## 2016-11-28 DIAGNOSIS — Z01419 Encounter for gynecological examination (general) (routine) without abnormal findings: Secondary | ICD-10-CM | POA: Diagnosis not present

## 2016-11-28 DIAGNOSIS — Z1231 Encounter for screening mammogram for malignant neoplasm of breast: Secondary | ICD-10-CM

## 2016-11-28 DIAGNOSIS — Z803 Family history of malignant neoplasm of breast: Secondary | ICD-10-CM

## 2016-11-28 DIAGNOSIS — Z23 Encounter for immunization: Secondary | ICD-10-CM

## 2016-11-28 LAB — LIPID PANEL
CHOLESTEROL: 185 mg/dL (ref <200)
HDL: 46 mg/dL — AB (ref >50)
LDL CHOLESTEROL (CALC): 117 mg/dL — AB
Non-HDL Cholesterol (Calc): 139 mg/dL (calc) — ABNORMAL HIGH (ref <130)
TRIGLYCERIDES: 116 mg/dL (ref <150)
Total CHOL/HDL Ratio: 4 (calc) (ref <5.0)

## 2016-11-28 LAB — CBC WITH DIFFERENTIAL/PLATELET
Basophils Absolute: 40 cells/uL (ref 0–200)
Basophils Relative: 0.6 %
Eosinophils Absolute: 92 cells/uL (ref 15–500)
Eosinophils Relative: 1.4 %
HCT: 37.4 % (ref 35.0–45.0)
Hemoglobin: 12.7 g/dL (ref 11.7–15.5)
Lymphs Abs: 1709 cells/uL (ref 850–3900)
MCH: 28.9 pg (ref 27.0–33.0)
MCHC: 34 g/dL (ref 32.0–36.0)
MCV: 85.2 fL (ref 80.0–100.0)
MPV: 10.9 fL (ref 7.5–12.5)
Monocytes Relative: 5.7 %
NEUTROS PCT: 66.4 %
Neutro Abs: 4382 cells/uL (ref 1500–7800)
PLATELETS: 282 10*3/uL (ref 140–400)
RBC: 4.39 10*6/uL (ref 3.80–5.10)
RDW: 12.5 % (ref 11.0–15.0)
TOTAL LYMPHOCYTE: 25.9 %
WBC: 6.6 10*3/uL (ref 3.8–10.8)
WBCMIX: 376 {cells}/uL (ref 200–950)

## 2016-11-28 LAB — COMPREHENSIVE METABOLIC PANEL
AG Ratio: 1.4 (calc) (ref 1.0–2.5)
ALKALINE PHOSPHATASE (APISO): 120 U/L — AB (ref 33–115)
ALT: 9 U/L (ref 6–29)
AST: 13 U/L (ref 10–30)
Albumin: 4.3 g/dL (ref 3.6–5.1)
BUN: 12 mg/dL (ref 7–25)
CO2: 28 mmol/L (ref 20–32)
Calcium: 9.1 mg/dL (ref 8.6–10.2)
Chloride: 108 mmol/L (ref 98–110)
Creat: 0.59 mg/dL (ref 0.50–1.10)
Globulin: 3 g/dL (calc) (ref 1.9–3.7)
Glucose, Bld: 88 mg/dL (ref 65–99)
Potassium: 4.2 mmol/L (ref 3.5–5.3)
Sodium: 141 mmol/L (ref 135–146)
Total Bilirubin: 0.5 mg/dL (ref 0.2–1.2)
Total Protein: 7.3 g/dL (ref 6.1–8.1)

## 2016-11-28 NOTE — Addendum Note (Signed)
Addended by: Dayna Barker on: 11/28/2016 09:43 AM   Modules accepted: Orders

## 2016-11-28 NOTE — Progress Notes (Signed)
    [name] August 03, 1980 [id number]        36 y.o.  [id number] for annual gynecologic exam.    Past medical history,surgical history, problem list, medications, allergies, family history and social history were all reviewed and documented as reviewed in the EPIC chart.  ROS:  Performed with pertinent positives and negatives included in the history, assessment and plan.   Additional significant findings :  None   Exam: Kennon Portela assistant Vitals:   11/28/16 0842  BP: 118/76  Weight: 163 lb (73.9 kg)  Height:  (1.6 m)   Body mass index is 28.87 kg/m.  General appearance:  Normal affect, orientation and appearance. Skin: Grossly normal HEENT: Without gross lesions.  No cervical or supraclavicular adenopathy. Thyroid normal.  Lungs:  Clear without wheezing, rales or rhonchi Cardiac: RR, without RMG Abdominal:  Soft, nontender, without masses, guarding, rebound, organomegaly or hernia Breasts:  Examined lying and sitting without masses, retractions, discharge or axillary adenopathy. Pelvic:  Ext, BUS, Vagina: Normal. Pap smear cuff done  Adnexa: Without masses or tenderness    Anus and perineum: Normal   Rectovaginal: Normal sphincter tone without palpated masses or tenderness.    Assessment/Plan:  36 y.o. [id number] female for annual gynecologic exam.   1. Status post LAVH bilateral salpingectomies earlier this year for refractile heavy bleeding and endometriosis. Doing well with no pain or other symptoms. 2. Maternal history of breast cancer at age 12.  We discussed in the past possible genetic linkage. Had been referred for genetic counseling the patient has declined due to cost. We also have discussed possible increased risk of ovarian cancer and she declined oophorectomy at the time of her hysterectomy. I again reviewed that risk with her. Recommended annual mammography and she's going to schedule. Issues of MRI/ovarian ultrasound surveillance/CA 125 screening with  false positive and false-negative issues. At this point patient will continue annual mammography and she will call to schedule. 3. Pap smear 2015. Pap smear of vaginal cuff today. History of LEEP in the past. Will continue with vaginal cuff surveillance every 3 years. 4. Health maintenance. Baseline CBC, CMP, lipid profile ordered. Follow up in one year, sooner as needed.   [name], 9:05 AM 11/28/2016

## 2016-11-28 NOTE — Patient Instructions (Signed)
Call to Schedule your mammogram  [facility]. [facility], [address] Phone: [phone]     Mammogram A mammogram is an X-ray test to find changes in a woman's breast. You should get a mammogram if:  You are 36 years of age or older  You have risk factors.   Your doctor recommends that you have one.  BEFORE THE TEST  Do not schedule the test the week before your period, especially if your breasts are sore during this time.  On the day of your mammogram:  Wash your breasts and armpits well. After washing, do not put on any deodorant or talcum powder on until after your test.   Eat and drink as you usually do.   Take your medicines as usual.   If you are diabetic and take insulin, make sure you:   Eat before coming for your test.   Take your insulin as usual.   If you cannot keep your appointment, call before the appointment to cancel. Schedule another appointment.  TEST  You will need to undress from the waist up. You will put on a hospital gown.   Your breast will be put on the mammogram machine, and it will press firmly on your breast with a piece of plastic called a compression paddle. This will make your breast flatter so that the machine can X-ray all parts of your breast.   Both breasts will be X-rayed. Each breast will be X-rayed from above and from the side. An X-ray might need to be taken again if the picture is not good enough.   The mammogram will last about 15 to 30 minutes.  AFTER THE TEST Finding out the results of your test Ask when your test results will be ready. Make sure you get your test results.  Document Released: 05/06/2008 Document Revised: 01/27/2011 Document Reviewed: 05/06/2008 Exit[employer] Patient Information 2012 [employer].  

## 2016-11-30 LAB — PAP IG W/ RFLX HPV ASCU

## 2016-12-09 ENCOUNTER — Ambulatory Visit
Admission: RE | Admit: 2016-12-09 | Discharge: 2016-12-09 | Disposition: A | Discharge status: Home / Self Care | Payer: BLUE CROSS/BLUE SHIELD | Source: Ambulatory Visit | Attending: Gynecology | Admitting: Gynecology

## 2016-12-09 DIAGNOSIS — Z1231 Encounter for screening mammogram for malignant neoplasm of breast: Secondary | ICD-10-CM | POA: Diagnosis not present

## 2016-12-14 DIAGNOSIS — M9901 Segmental and somatic dysfunction of cervical region: Secondary | ICD-10-CM | POA: Diagnosis not present

## 2016-12-14 DIAGNOSIS — M542 Cervicalgia: Secondary | ICD-10-CM | POA: Diagnosis not present

## 2016-12-14 DIAGNOSIS — M545 Low back pain: Secondary | ICD-10-CM | POA: Diagnosis not present

## 2016-12-14 DIAGNOSIS — M9903 Segmental and somatic dysfunction of lumbar region: Secondary | ICD-10-CM | POA: Diagnosis not present

## 2016-12-28 DIAGNOSIS — M9901 Segmental and somatic dysfunction of cervical region: Secondary | ICD-10-CM | POA: Diagnosis not present

## 2016-12-28 DIAGNOSIS — M9903 Segmental and somatic dysfunction of lumbar region: Secondary | ICD-10-CM | POA: Diagnosis not present

## 2016-12-28 DIAGNOSIS — M545 Low back pain: Secondary | ICD-10-CM | POA: Diagnosis not present

## 2016-12-28 DIAGNOSIS — M542 Cervicalgia: Secondary | ICD-10-CM | POA: Diagnosis not present

## 2017-02-01 ENCOUNTER — Other Ambulatory Visit: Payer: Self-pay | Admitting: Women's Health

## 2017-02-01 DIAGNOSIS — B009 Herpesviral infection, unspecified: Secondary | ICD-10-CM

## 2017-02-06 DIAGNOSIS — M542 Cervicalgia: Secondary | ICD-10-CM | POA: Diagnosis not present

## 2017-02-06 DIAGNOSIS — M9901 Segmental and somatic dysfunction of cervical region: Secondary | ICD-10-CM | POA: Diagnosis not present

## 2017-02-06 DIAGNOSIS — M9903 Segmental and somatic dysfunction of lumbar region: Secondary | ICD-10-CM | POA: Diagnosis not present

## 2017-02-06 DIAGNOSIS — M545 Low back pain: Secondary | ICD-10-CM | POA: Diagnosis not present

## 2017-11-20 ENCOUNTER — Other Ambulatory Visit: Payer: Self-pay | Admitting: Gynecology

## 2017-11-20 DIAGNOSIS — Z1231 Encounter for screening mammogram for malignant neoplasm of breast: Secondary | ICD-10-CM

## 2017-11-29 ENCOUNTER — Encounter: Payer: BLUE CROSS/BLUE SHIELD | Admitting: Gynecology

## 2017-12-08 ENCOUNTER — Ambulatory Visit (INDEPENDENT_AMBULATORY_CARE_PROVIDER_SITE_OTHER): Payer: BLUE CROSS/BLUE SHIELD | Admitting: Gynecology

## 2017-12-08 ENCOUNTER — Encounter: Payer: Self-pay | Admitting: Gynecology

## 2017-12-08 VITALS — BP 118/76 | Ht 63.0 in | Wt 160.0 lb

## 2017-12-08 DIAGNOSIS — Z01419 Encounter for gynecological examination (general) (routine) without abnormal findings: Secondary | ICD-10-CM | POA: Diagnosis not present

## 2017-12-08 DIAGNOSIS — Z23 Encounter for immunization: Secondary | ICD-10-CM

## 2017-12-08 DIAGNOSIS — Z13220 Encounter for screening for lipoid disorders: Secondary | ICD-10-CM

## 2017-12-08 NOTE — Patient Instructions (Signed)
Follow-up for your fasting blood work.  Follow-up for your mammogram as scheduled

## 2017-12-08 NOTE — Progress Notes (Signed)
    [name] [birth year] [id number]        37 y.o.  [name] for annual gynecologic exam.  Without gynecologic complaints  Past medical history,surgical history, problem list, medications, allergies, family history and social history were all reviewed and documented as reviewed in the EPIC chart.  ROS:  Performed with pertinent positives and negatives included in the history, assessment and plan.   Additional significant findings : None   Exam: Kennon Portela assistant Vitals:   12/08/17 1456  BP: 118/76  Weight: 160 lb (72.6 kg)  Height: 5\' 3"  (1.6 m)   Body mass index is 28.34 kg/m.  General appearance:  Normal affect, orientation and appearance. Skin: Grossly normal HEENT: Without gross lesions.  No cervical or supraclavicular adenopathy. Thyroid normal.  Lungs:  Clear without wheezing, rales or rhonchi Cardiac: RR, without RMG Abdominal:  Soft, nontender, without masses, guarding, rebound, organomegaly or hernia Breasts:  Examined lying and sitting without masses, retractions, discharge or axillary adenopathy. Pelvic:  Ext, BUS, Vagina: Normal.    Adnexa: Without masses or tenderness    Anus and perineum: Normal   Rectovaginal: Normal sphincter tone without palpated masses or tenderness.    Assessment/Plan:  37 y.o. A2Z3086 female for annual gynecologic exam status post LAVH bilateral salpingectomies due to heavy bleeding and endometriosis.  Without gynecologic complaints.   1. Mammography 11/2016.  Patient has follow-up mammogram scheduled now.  Breast exam normal today.  History of maternal breast cancer at age 32.  With have discussed possible genetic linkage and options for management to include genetic counseling with testing, MRI screening, ovarian ultrasound surveillance, CA 125 screening have all been discussed.  She declines any special testing other than she plans on annual mammography.  SBE reviewed. 2. Pap smear 2018.  No Pap smear done today.  History of  LEEP in the past.  Plan repeat Pap smear at 3-year interval. 3. Health maintenance.  Future orders placed for fasting CBC, CMP and lipid profile and patient will follow-up to have drawn.  Follow-up in 1 year, sooner as needed.   [name], 3:29 PM 12/08/2017

## 2017-12-20 ENCOUNTER — Ambulatory Visit
Admission: RE | Admit: 2017-12-20 | Discharge: 2017-12-20 | Disposition: A | Discharge status: Home / Self Care | Payer: BLUE CROSS/BLUE SHIELD | Source: Ambulatory Visit | Attending: Gynecology | Admitting: Gynecology

## 2017-12-20 DIAGNOSIS — Z1231 Encounter for screening mammogram for malignant neoplasm of breast: Secondary | ICD-10-CM

## 2018-01-01 ENCOUNTER — Other Ambulatory Visit: Payer: BLUE CROSS/BLUE SHIELD

## 2018-04-20 ENCOUNTER — Other Ambulatory Visit: Payer: Self-pay | Admitting: Gynecology

## 2018-04-20 DIAGNOSIS — B009 Herpesviral infection, unspecified: Secondary | ICD-10-CM

## 2018-09-04 DIAGNOSIS — T63484A Toxic effect of venom of other arthropod, undetermined, initial encounter: Secondary | ICD-10-CM | POA: Diagnosis not present

## 2018-10-10 ENCOUNTER — Other Ambulatory Visit: Payer: Self-pay | Admitting: Gynecology

## 2018-10-10 DIAGNOSIS — Z1231 Encounter for screening mammogram for malignant neoplasm of breast: Secondary | ICD-10-CM

## 2018-11-13 ENCOUNTER — Encounter: Payer: Self-pay | Admitting: Gynecology

## 2018-12-07 ENCOUNTER — Other Ambulatory Visit: Payer: Self-pay

## 2018-12-10 ENCOUNTER — Encounter: Payer: Self-pay | Admitting: Gynecology

## 2018-12-10 ENCOUNTER — Ambulatory Visit (INDEPENDENT_AMBULATORY_CARE_PROVIDER_SITE_OTHER): Payer: BC Managed Care – PPO | Admitting: Gynecology

## 2018-12-10 ENCOUNTER — Other Ambulatory Visit: Payer: Self-pay

## 2018-12-10 VITALS — BP 118/78 | Ht 64.0 in | Wt 164.0 lb

## 2018-12-10 DIAGNOSIS — Z13220 Encounter for screening for lipoid disorders: Secondary | ICD-10-CM

## 2018-12-10 DIAGNOSIS — Z01419 Encounter for gynecological examination (general) (routine) without abnormal findings: Secondary | ICD-10-CM | POA: Diagnosis not present

## 2018-12-10 DIAGNOSIS — Z23 Encounter for immunization: Secondary | ICD-10-CM | POA: Diagnosis not present

## 2018-12-10 LAB — COMPREHENSIVE METABOLIC PANEL
AG Ratio: 1.4 (calc) (ref 1.0–2.5)
ALT: 11 U/L (ref 6–29)
AST: 15 U/L (ref 10–30)
Albumin: 4.2 g/dL (ref 3.6–5.1)
Alkaline phosphatase (APISO): 91 U/L (ref 31–125)
BUN: 14 mg/dL (ref 7–25)
CO2: 27 mmol/L (ref 20–32)
Calcium: 8.9 mg/dL (ref 8.6–10.2)
Chloride: 107 mmol/L (ref 98–110)
Creat: 0.64 mg/dL (ref 0.50–1.10)
Globulin: 3 g/dL (calc) (ref 1.9–3.7)
Glucose, Bld: 83 mg/dL (ref 65–99)
Potassium: 3.7 mmol/L (ref 3.5–5.3)
Sodium: 140 mmol/L (ref 135–146)
Total Bilirubin: 0.4 mg/dL (ref 0.2–1.2)
Total Protein: 7.2 g/dL (ref 6.1–8.1)

## 2018-12-10 LAB — CBC WITH DIFFERENTIAL/PLATELET
Absolute Monocytes: 399 cells/uL (ref 200–950)
Basophils Absolute: 28 cells/uL (ref 0–200)
Basophils Relative: 0.4 %
Eosinophils Absolute: 91 cells/uL (ref 15–500)
Eosinophils Relative: 1.3 %
HCT: 39.5 % (ref 35.0–45.0)
Hemoglobin: 13.3 g/dL (ref 11.7–15.5)
Lymphs Abs: 1862 cells/uL (ref 850–3900)
MCH: 29.4 pg (ref 27.0–33.0)
MCHC: 33.7 g/dL (ref 32.0–36.0)
MCV: 87.2 fL (ref 80.0–100.0)
MPV: 11.1 fL (ref 7.5–12.5)
Monocytes Relative: 5.7 %
Neutro Abs: 4620 cells/uL (ref 1500–7800)
Neutrophils Relative %: 66 %
Platelets: 293 10*3/uL (ref 140–400)
RBC: 4.53 10*6/uL (ref 3.80–5.10)
RDW: 12.3 % (ref 11.0–15.0)
Total Lymphocyte: 26.6 %
WBC: 7 10*3/uL (ref 3.8–10.8)

## 2018-12-10 LAB — LIPID PANEL
Cholesterol: 221 mg/dL — ABNORMAL HIGH (ref <200)
HDL: 47 mg/dL — ABNORMAL LOW (ref >=50)
LDL Cholesterol (Calc): 149 mg/dL (calc) — ABNORMAL HIGH
Non-HDL Cholesterol (Calc): 174 mg/dL (calc) — ABNORMAL HIGH (ref <130)
Total CHOL/HDL Ratio: 4.7 (calc) (ref <5.0)
Triglycerides: 124 mg/dL (ref <150)

## 2018-12-10 NOTE — Patient Instructions (Signed)
Follow-up in 1 year for annual exam, sooner as needed. 

## 2018-12-10 NOTE — Progress Notes (Signed)
    [name] November 22, 1980 [id number]        38 y.o.  [id number] for annual gynecologic exam.  Without gynecologic complaints  Past medical history,surgical history, problem list, medications, allergies, family history and social history were all reviewed and documented as reviewed in the EPIC chart.  ROS:  Performed with pertinent positives and negatives included in the history, assessment and plan.   Additional significant findings : None   Exam: Caryn Bee assistant Vitals:   12/10/18 0828  BP: 118/78  Weight: 164 lb (74.4 kg)  Height: 5\' 4"  (1.626 m)   Body mass index is 28.15 kg/m.  General appearance:  Normal affect, orientation and appearance. Skin: Grossly normal HEENT: Without gross lesions.  No cervical or supraclavicular adenopathy. Thyroid normal.  Lungs:  Clear without wheezing, rales or rhonchi Cardiac: RR, without RMG Abdominal:  Soft, nontender, without masses, guarding, rebound, organomegaly or hernia Breasts:  Examined lying and sitting without masses, retractions, discharge or axillary adenopathy. Pelvic:  Ext, BUS, Vagina: Normal  Adnexa: Without masses or tenderness    Anus and perineum: Normal   Rectovaginal: Normal sphincter tone without palpated masses or tenderness.    Assessment/Plan:  38 y.o. [id number] female for annual gynecologic exam.  Status post LAVH bilateral salpingectomies due to menorrhagia and endometriosis.  1. Pap smear 2018.  No Pap smear done today.  Plan repeat Pap smear next year at 3-year interval.  History of LEEP x2 for high-grade dysplasia 2002.  Discussed current screening guidelines. 2. Mammography 11/2017.  Has scheduled beginning of November.  History of maternal breast cancer at age 46.  We have discussed on multiple occasions possible genetic linkage and possible increased risk of breast and ovarian cancer.  Options to include genetic counseling, MRI, ovarian ultrasound surveillance, CA 125 screening have all been discussed  and offered.  At this point the patient has declined testing and plans on annual mammography. 3. Health maintenance.  Baseline CBC, CMP and lipid profile ordered.  Follow-up in 1 year, sooner as needed. Anastasio Auerbach MD, 8:54 AM 12/10/2018

## 2018-12-11 ENCOUNTER — Other Ambulatory Visit: Payer: Self-pay

## 2018-12-11 DIAGNOSIS — E7800 Pure hypercholesterolemia, unspecified: Secondary | ICD-10-CM

## 2018-12-24 ENCOUNTER — Ambulatory Visit
Admission: RE | Admit: 2018-12-24 | Discharge: 2018-12-24 | Disposition: A | Discharge status: Home / Self Care | Payer: BLUE CROSS/BLUE SHIELD | Source: Ambulatory Visit | Attending: Gynecology | Admitting: Gynecology

## 2018-12-24 ENCOUNTER — Other Ambulatory Visit: Payer: Self-pay

## 2018-12-24 DIAGNOSIS — Z1231 Encounter for screening mammogram for malignant neoplasm of breast: Secondary | ICD-10-CM | POA: Diagnosis not present

## 2019-01-28 ENCOUNTER — Other Ambulatory Visit: Payer: Self-pay

## 2019-01-29 ENCOUNTER — Ambulatory Visit: Payer: BC Managed Care – PPO | Admitting: Women's Health

## 2019-01-29 ENCOUNTER — Encounter: Payer: Self-pay | Admitting: Women's Health

## 2019-01-29 VITALS — BP 122/80

## 2019-01-29 DIAGNOSIS — L659 Nonscarring hair loss, unspecified: Secondary | ICD-10-CM

## 2019-01-29 DIAGNOSIS — N951 Menopausal and female climacteric states: Secondary | ICD-10-CM | POA: Diagnosis not present

## 2019-01-29 MED ORDER — VENLAFAXINE HCL ER 37.5 MG PO CP24: 37.5000 mg | ORAL_CAPSULE | Freq: Every day | ORAL | 1 refills | Status: DC

## 2019-01-29 NOTE — Progress Notes (Signed)
38 year old MWF G4 P2 presents with complaint of hot flashes, poor sleep, hair loss, mood swings with sadness and generally not feeling well.  Denies any feelings of self-harm.  04/2016 LAVH, salpingectomy for menorrhagia.  Daughter 25,  94 , works from home in [employer] quality [employer].  Reports home and work life good.  Has taken biotin in the past that have nausea and did not feel well on.  Denies vaginal discharge, abdominal pain, urinary symptoms or fever.  Medical problems include history of HSV.  Exam: Appears well.  Hair is thin with no bald spots.  Able to articulate feelings well.  Hot flashes with mood changes and hair loss  Plan: FSH, TSH.  Reviewed options of low-dose Effexor which may help with mood as well as hot flashes, would like to try Effexor 37.5 reviewed it is a low dose take daily, reviewed importance of self-care, daily walks, counseling encouraged.

## 2019-01-29 NOTE — Patient Instructions (Signed)

## 2019-01-30 ENCOUNTER — Telehealth: Payer: Self-pay

## 2019-01-30 LAB — FOLLICLE STIMULATING HORMONE: FSH: 5.6 m[IU]/mL

## 2019-01-30 LAB — TSH: TSH: 1.57 mIU/L

## 2019-01-30 NOTE — Telephone Encounter (Signed)
Patient informed of results. She asked "well then why is my hair falling out".  I told her we usually recommend dermatologist if that persists. She asked "well then why are my breasts sore". I told her I did not know and asked if she discussed that with [place]. She said she did but was told we will wait and see what the labs show.

## 2019-01-30 NOTE — Telephone Encounter (Signed)
-----   Message from Huel Cote, NP sent at 01/30/2019  7:54 AM EST ----- Please call, FSH, TSH both normal.  NOT menopausal, hot flashes are just a hormonal fluctuations, try OTC vit E twice daily, may help as well as the effexor.

## 2019-01-30 NOTE — Telephone Encounter (Signed)
Telephone call, questions answered, will try vitamin E twice daily, states is under increased stress reviewed may be causing some increased hair loss.  Dermatology appointment if continued problems with hair loss.

## 2019-03-13 DIAGNOSIS — R399 Unspecified symptoms and signs involving the genitourinary system: Secondary | ICD-10-CM | POA: Diagnosis not present

## 2019-03-15 DIAGNOSIS — R899 Unspecified abnormal finding in specimens from other organs, systems and tissues: Secondary | ICD-10-CM | POA: Diagnosis not present

## 2019-03-15 DIAGNOSIS — R300 Dysuria: Secondary | ICD-10-CM | POA: Diagnosis not present

## 2019-04-01 DIAGNOSIS — M545 Low back pain: Secondary | ICD-10-CM | POA: Diagnosis not present

## 2019-04-01 DIAGNOSIS — M542 Cervicalgia: Secondary | ICD-10-CM | POA: Diagnosis not present

## 2019-04-01 DIAGNOSIS — M9901 Segmental and somatic dysfunction of cervical region: Secondary | ICD-10-CM | POA: Diagnosis not present

## 2019-04-01 DIAGNOSIS — M9903 Segmental and somatic dysfunction of lumbar region: Secondary | ICD-10-CM | POA: Diagnosis not present

## 2019-04-29 DIAGNOSIS — M9903 Segmental and somatic dysfunction of lumbar region: Secondary | ICD-10-CM | POA: Diagnosis not present

## 2019-04-29 DIAGNOSIS — M542 Cervicalgia: Secondary | ICD-10-CM | POA: Diagnosis not present

## 2019-04-29 DIAGNOSIS — M545 Low back pain: Secondary | ICD-10-CM | POA: Diagnosis not present

## 2019-04-29 DIAGNOSIS — M9901 Segmental and somatic dysfunction of cervical region: Secondary | ICD-10-CM | POA: Diagnosis not present

## 2019-05-27 DIAGNOSIS — M542 Cervicalgia: Secondary | ICD-10-CM | POA: Diagnosis not present

## 2019-05-27 DIAGNOSIS — M9903 Segmental and somatic dysfunction of lumbar region: Secondary | ICD-10-CM | POA: Diagnosis not present

## 2019-05-27 DIAGNOSIS — M9901 Segmental and somatic dysfunction of cervical region: Secondary | ICD-10-CM | POA: Diagnosis not present

## 2019-05-27 DIAGNOSIS — M545 Low back pain: Secondary | ICD-10-CM | POA: Diagnosis not present

## 2019-07-31 ENCOUNTER — Other Ambulatory Visit: Payer: Self-pay

## 2019-07-31 ENCOUNTER — Emergency Department (HOSPITAL_BASED_OUTPATIENT_CLINIC_OR_DEPARTMENT_OTHER)
Admission: EM | Admit: 2019-07-31 | Discharge: 2019-07-31 | Disposition: A | Discharge status: Home / Self Care | Payer: BC Managed Care – PPO | Attending: Emergency Medicine | Admitting: Emergency Medicine

## 2019-07-31 ENCOUNTER — Encounter (HOSPITAL_BASED_OUTPATIENT_CLINIC_OR_DEPARTMENT_OTHER): Payer: Self-pay | Admitting: *Deleted

## 2019-07-31 DIAGNOSIS — S0992XA Unspecified injury of nose, initial encounter: Secondary | ICD-10-CM | POA: Diagnosis not present

## 2019-07-31 DIAGNOSIS — Z91040 Latex allergy status: Secondary | ICD-10-CM | POA: Diagnosis not present

## 2019-07-31 DIAGNOSIS — W268XXA Contact with other sharp object(s), not elsewhere classified, initial encounter: Secondary | ICD-10-CM | POA: Diagnosis not present

## 2019-07-31 DIAGNOSIS — Y939 Activity, unspecified: Secondary | ICD-10-CM | POA: Diagnosis not present

## 2019-07-31 DIAGNOSIS — S0121XA Laceration without foreign body of nose, initial encounter: Secondary | ICD-10-CM

## 2019-07-31 DIAGNOSIS — Y929 Unspecified place or not applicable: Secondary | ICD-10-CM | POA: Insufficient documentation

## 2019-07-31 DIAGNOSIS — Y999 Unspecified external cause status: Secondary | ICD-10-CM | POA: Insufficient documentation

## 2019-07-31 MED ORDER — TETANUS-DIPHTH-ACELL PERTUSSIS 5-2.5-18.5 LF-MCG/0.5 IM SUSP
0.5000 mL | Freq: Once | INTRAMUSCULAR | Status: AC
  Administered 2019-07-31: 0.5 mL via INTRAMUSCULAR
  Filled 2019-07-31: qty 0.5

## 2019-07-31 MED ORDER — CEPHALEXIN 500 MG PO CAPS: 500.0000 mg | ORAL_CAPSULE | Freq: Two times a day (BID) | ORAL | 0 refills | Status: AC

## 2019-07-31 NOTE — ED Provider Notes (Signed)
[facility] EMERGENCY DEPARTMENT Provider Note   CSN: [id number] Arrival date & time: 07/31/19  1841     History Chief Complaint  Patient presents with  . Laceration    [name] is a 39 y.o. female.  HPI  Patient is a 39 year old female with no pertinent past medical history presented today with left nasal pain after she was washing her face today and accidentally inserted her other left pinky finger which is a synthetic fingernail into her nare.  She states that she had a nosebleed medially afterwards but states that she was able to control the bleeding with pressure.  She states that it is painful, achy, sore, no current bleeding.  She states she is up-to-date on her vaccinations including tetanus.  She denies any HIV, prednisone use, steroid use, or other immunosuppressive disease.  She states she is feeling well.  Denies any lightheadedness or dizziness.  She states she has a slight headache.  She has taken no medications prior to arrival.     Past Medical History:  Diagnosis Date  . Anemia   . Arthritis    right thumb  . Chlamydia 2006, 2008  . CIN II (cervical intraepithelial neoplasia II) 2004  . Complication of anesthesia    tubal ligation woke up in a lot of pain  . Endometriosis 2015   fulgurated at laparoscopy  . Fracture of unspecified phalanx of right thumb, initial encounter for closed fracture   . GERD (gastroesophageal reflux disease)   . Headache    from megestrol  . HSV-2 (herpes simplex virus 2) infection   . Overactive bladder   . Pneumonia    12/2015  . Seasonal allergies   . Urinary incontinence     Patient Active Problem List   Diagnosis Date Noted  . Overactive bladder   . HSV-2 (herpes simplex virus 2) infection     Past Surgical History:  Procedure Laterality Date  . CERVICAL BIOPSY  W/ LOOP ELECTRODE EXCISION  2002   x2  . CKC  2004  . COLPOSCOPY    . CYSTOSCOPY N/A 04/26/2016   Procedure: CYSTOSCOPY;  Surgeon: [name], MD;  Location: [facility];  Service: Gynecology;  Laterality: N/A;  . LAPAROSCOPIC TUBAL LIGATION N/A 11/21/2013   Procedure: LAPAROSCOPIC TUBAL LIGATION WITH FALLOP RINGS AND FULGERATION OF ENDOMETRIOSIS;  Surgeon: [name], MD;  Location: [facility];  Service: Gynecology;  Laterality: N/A;  . LAPAROSCOPIC VAGINAL HYSTERECTOMY WITH SALPINGECTOMY Bilateral 04/26/2016   Procedure: LAPAROSCOPIC ASSISTED VAGINAL HYSTERECTOMY WITH SALPINGECTOMY;  Surgeon: [name], MD;  Location: [facility];  Service: Gynecology;  Laterality: Bilateral;  request 7:30am OR time  Requests 2 1/2 hours OR time  . TUBAL LIGATION       OB History    Gravida  4   Para  2   Term      Preterm      AB  2   Living  2     SAB      TAB      Ectopic      Multiple      Live Births              Family History  Problem Relation Age of Onset  . Breast cancer Mother 26  . Cancer Father        Lung cancer    Social History   Tobacco Use  . Smoking status: Never Smoker  . Smokeless tobacco: Never Used  Substance Use Topics  . Alcohol use: Not Currently    Alcohol/week: 0.0 standard drinks  . Drug use: No    Home Medications Prior to Admission medications   Medication Sig Start Date End Date Taking? Authorizing Provider  cephALEXin (KEFLEX) 500 MG capsule Take 1 capsule (500 mg total) by mouth 2 (two) times daily for 5 days. 07/31/19 08/05/19  Gailen Shelter, PA  Levocetirizine Dihydrochloride (XYZAL PO) Take by mouth.    [provider]  valACYclovir (VALTREX) 500 MG tablet TAKE 1 TABLET BY MOUTH TWICE DAILY FOR 3-5 DAYS AS NEEDED 04/20/18   Fontaine, Nadyne Coombes, MD  venlafaxine XR (EFFEXOR XR) 37.5 MG 24 hr capsule Take 1 capsule (37.5 mg total) by mouth daily with breakfast. 01/29/19   Harrington Challenger, NP    Allergies    Latex  Review of Systems   Review of Systems  Constitutional: Negative for fever.  HENT: Negative for congestion.        Nose pain    Respiratory: Negative for shortness of breath.   Cardiovascular: Negative for chest pain.  Gastrointestinal: Negative for abdominal distention.  Neurological: Negative for dizziness and headaches.    Physical Exam Updated Vital Signs BP (!) 141/84 (BP Location: Left Arm)   Pulse 71   Temp 98.5 F (36.9 C) (Oral)   Resp 16   Ht 5\' 2"  (1.575 m)   Wt 79.8 kg   LMP 03/21/2016   SpO2 100%   BMI 32.19 kg/m   Physical Exam Vitals and nursing note reviewed.  Constitutional:      General: She is not in acute distress.    Appearance: Normal appearance. She is not ill-appearing.  HENT:     Head: Normocephalic and atraumatic.     Nose:     Comments: Small, 0.9 cm laceration to the upper inside nasal epithelium of the left nare.  No septal hematoma.  No evidence of trauma.  There is no active bleeding.  Wound is nongaping and appears somewhat superficial. Eyes:     General: No scleral icterus.       Right eye: No discharge.        Left eye: No discharge.     Conjunctiva/sclera: Conjunctivae normal.  Pulmonary:     Effort: Pulmonary effort is normal.     Breath sounds: No stridor.  Neurological:     Mental Status: She is alert and oriented to person, place, and time. Mental status is at baseline.     ED Results / Procedures / Treatments   Labs (all labs ordered are listed, but only abnormal results are displayed) Labs Reviewed - No data to display  EKG None  Radiology No results found.  Procedures Procedures (including critical care time)  Medications Ordered in ED Medications - No data to display  ED Course  I have reviewed the triage vital signs and the nursing notes.  Pertinent labs & imaging results that were available during my care of the patient were reviewed by me and considered in my medical decision making (see chart for details).    MDM Rules/Calculators/A&P                      Patient is a 39 year old female with past medical history detailed above  presented today with left nostril pain.  Patient states she was washing her face when she has only took her nail into her nose and immediately began having a nosebleed.  On physical exam  patient does have a small laceration to the left nare on the inside of the nose on the top of the antrum of the left nare. Is not currently bleeding is not gaping or appear deep.  I discussed this case with my attending physician who cosigned this note including patient's presenting symptoms, physical exam, and planned diagnostics and interventions. Attending physician stated agreement with plan or made changes to plan which were implemented.   Patient discharged with about a prophylaxis that she is concerned for wound care.  She will follow up with her primary care doctor in several days or early next week for reevaluation.  She will use gauze soaked in antibiotic ointment or petroleum jelly and keep area clean and dry.    Final Clinical Impression(s) / ED Diagnoses Final diagnoses:  Laceration of nose, initial encounter    Rx / DC Orders ED Discharge Orders         Ordered    cephALEXin (KEFLEX) 500 MG capsule  2 times daily     07/31/19 2049           Pati Gallo [place], [place] 07/31/19 2358    Gareth Morgan, MD 08/02/19 1545

## 2019-07-31 NOTE — ED Triage Notes (Signed)
Pt c/o lac to inside of left nare x 1 hr ago , bleeding controlled

## 2019-07-31 NOTE — Discharge Instructions (Addendum)
I have provided you with an antibiotic to use as prescribed 5 days to cover for and prevent infection.  Please take as prescribed.  Also recommend taking pain medication as described below.  Follow-up with your primary care doctor within the next week for reevaluation of the wound.  Please return for any pus coming from the wound, fevers, nausea, vomiting.

## 2019-07-31 NOTE — ED Notes (Signed)
ENT cart at bedside

## 2019-09-26 ENCOUNTER — Other Ambulatory Visit: Payer: Self-pay | Admitting: Women's Health

## 2019-09-26 ENCOUNTER — Other Ambulatory Visit: Payer: Self-pay | Admitting: Nurse Practitioner

## 2019-09-26 DIAGNOSIS — Z1231 Encounter for screening mammogram for malignant neoplasm of breast: Secondary | ICD-10-CM

## 2019-11-06 DIAGNOSIS — M722 Plantar fascial fibromatosis: Secondary | ICD-10-CM | POA: Diagnosis not present

## 2019-11-20 DIAGNOSIS — Z5181 Encounter for therapeutic drug level monitoring: Secondary | ICD-10-CM | POA: Diagnosis not present

## 2019-12-02 DIAGNOSIS — M722 Plantar fascial fibromatosis: Secondary | ICD-10-CM | POA: Diagnosis not present

## 2019-12-11 ENCOUNTER — Ambulatory Visit (INDEPENDENT_AMBULATORY_CARE_PROVIDER_SITE_OTHER): Payer: BC Managed Care – PPO | Admitting: Nurse Practitioner

## 2019-12-11 ENCOUNTER — Other Ambulatory Visit: Payer: Self-pay

## 2019-12-11 ENCOUNTER — Encounter: Payer: Self-pay | Admitting: Nurse Practitioner

## 2019-12-11 ENCOUNTER — Telehealth: Payer: Self-pay | Admitting: *Deleted

## 2019-12-11 VITALS — BP 120/82 | Ht 64.0 in | Wt 164.0 lb

## 2019-12-11 DIAGNOSIS — Z01419 Encounter for gynecological examination (general) (routine) without abnormal findings: Secondary | ICD-10-CM | POA: Diagnosis not present

## 2019-12-11 DIAGNOSIS — K9041 Non-celiac gluten sensitivity: Secondary | ICD-10-CM

## 2019-12-11 DIAGNOSIS — N871 Moderate cervical dysplasia: Secondary | ICD-10-CM

## 2019-12-11 DIAGNOSIS — R198 Other specified symptoms and signs involving the digestive system and abdomen: Secondary | ICD-10-CM

## 2019-12-11 DIAGNOSIS — Z23 Encounter for immunization: Secondary | ICD-10-CM | POA: Diagnosis not present

## 2019-12-11 DIAGNOSIS — R232 Flushing: Secondary | ICD-10-CM

## 2019-12-11 DIAGNOSIS — K589 Irritable bowel syndrome without diarrhea: Secondary | ICD-10-CM

## 2019-12-11 DIAGNOSIS — Z90710 Acquired absence of both cervix and uterus: Secondary | ICD-10-CM | POA: Diagnosis not present

## 2019-12-11 DIAGNOSIS — E785 Hyperlipidemia, unspecified: Secondary | ICD-10-CM | POA: Diagnosis not present

## 2019-12-11 NOTE — Progress Notes (Signed)
[name] 07/13/1980 [id number]   History:  39 y.o. [id number] presents for annual exam without GYN complaints. 2018 LAVH with bilateral salpingectomies for menorrhagia. 2002 LEEP x 2 high grade dysplasia. Was started on Effexor a year ago for hot flashes but stopped taking. FSH 5.6 and normal TSH 01/2019. History of HSV-2, OAB. Mother diagnosed with breast cancer at age 36. She does complain of changes in bowel habits. She does not tolerate many foods and has cramping/pain with periods of constipation and diarrhea. She also complains of reflux. Denies nausea or vomiting. She has tried to adjust her diet and mostly consumes chicken, fish, and vegetables. She also has struggled with weight loss.    Gynecologic History Patient's last menstrual period was 03/21/2016.   Contraception: status post hysterectomy Last Pap: 11/28/2016. Results were: normal Last mammogram: 12/24/2018. Results were:  normal  Past medical history, past surgical history, family history and social history were all reviewed and documented in the EPIC chart.  ROS:  A ROS was performed and pertinent positives and negatives are included.  Exam:  Vitals:   12/11/19 0854  BP: 120/82  Weight: 164 lb (74.4 kg)  Height: 5\' 4"  (1.626 m)   Body mass index is 28.15 kg/m.  General appearance:  Normal Thyroid:  Symmetrical, normal in size, without palpable masses or nodularity. Respiratory  Auscultation:  Clear without wheezing or rhonchi Cardiovascular  Auscultation:  Regular rate, without rubs, murmurs or gallops  Edema/varicosities:  Not grossly evident Abdominal  Soft,nontender, without masses, guarding or rebound.  Liver/spleen:  No organomegaly noted  Hernia:  None appreciated  Skin  Inspection:  Grossly normal   Breasts: Examined lying and sitting.   Right: Without masses, retractions, discharge or axillary adenopathy.   Left: Without masses, retractions, discharge or axillary adenopathy. Gentitourinary    Inguinal/mons:  Normal without inguinal adenopathy  External genitalia:  Normal  BUS/Urethra/Skene's glands:  Normal  Vagina:  Normal  Cervix:  Absent  Uterus:  Absent  Adnexa/parametria:     Rt: Without masses or tenderness.   Lt: Without masses or tenderness.  Anus and perineum: Normal   Assessment/Plan:  39 y.o. 39 for annual exam.   Well female exam with routine gynecological exam - Plan: CBC with Differential/Platelet, Comprehensive metabolic panel, Lipid panel. Education provided on SBEs, importance of preventative screenings, current guidelines, high calcium diet, regular exercise, and multivitamin daily.   History of total vaginal hysterectomy (TVH) - 2018 for menorrhagia.   Dysplasia of cervix, high grade CIN 2 - 2002 LEEP x 2, subsequent paps normal. Pap today.   Hot flashes - Effexor did not help so she stopped taking. Normal FSH and TSH. After discussion it may be related to GI issues.   Need for immunization against influenza - Plan: Flu Vaccine QUAD 39+ mos IM  Alternating constipation and diarrhea - She complains of changes in bowel habits for 2-3 years. She thought it was related to her hysterectomy because it began around that time in 2018. She does not tolerate many foods and has cramping/pain with periods of constipation and diarrhea. She also complains of reflux. Denies nausea or vomiting. She has tried to adjust her diet and mostly consumes chicken, fish, and vegetables. She also has struggled with weight loss. We will send referral to GI for evaluation of IBS and gluten intolerance.   Screening for breast cancer - Has annual mammograms due to mother's history of breast cancer at age 87. Normal mammogram history. Normal breast exam today.  Follow up in 1 year for annual.      [name] [facility], 9:52 AM 12/11/2019

## 2019-12-11 NOTE — Patient Instructions (Signed)
Health Maintenance, Female Adopting a healthy lifestyle and getting preventive care are important in promoting health and wellness. Ask your health care provider about:  The right schedule for you to have regular tests and exams.  Things you can do on your own to prevent diseases and keep yourself healthy. What should I know about diet, weight, and exercise? Eat a healthy diet   Eat a diet that includes plenty of vegetables, fruits, low-fat dairy products, and lean protein.  Do not eat a lot of foods that are high in solid fats, added sugars, or sodium. Maintain a healthy weight Body mass index (BMI) is used to identify weight problems. It estimates body fat based on height and weight. Your health care provider can help determine your BMI and help you achieve or maintain a healthy weight. Get regular exercise Get regular exercise. This is one of the most important things you can do for your health. Most adults should:  Exercise for at least 150 minutes each week. The exercise should increase your heart rate and make you sweat (moderate-intensity exercise).  Do strengthening exercises at least twice a week. This is in addition to the moderate-intensity exercise.  Spend less time sitting. Even light physical activity can be beneficial. Watch cholesterol and blood lipids Have your blood tested for lipids and cholesterol at 39 years of age, then have this test every 5 years. Have your cholesterol levels checked more often if:  Your lipid or cholesterol levels are high.  You are older than 40 years of age.  You are at high risk for heart disease. What should I know about cancer screening? Depending on your health history and family history, you may need to have cancer screening at various ages. This may include screening for:  Breast cancer.  Cervical cancer.  Colorectal cancer.  Skin cancer.  Lung cancer. What should I know about heart disease, diabetes, and high blood  pressure? Blood pressure and heart disease  High blood pressure causes heart disease and increases the risk of stroke. This is more likely to develop in people who have high blood pressure readings, are of African descent, or are overweight.  Have your blood pressure checked: ? Every 3-5 years if you are 18-39 years of age. ? Every year if you are 40 years old or older. Diabetes Have regular diabetes screenings. This checks your fasting blood sugar level. Have the screening done:  Once every three years after age 40 if you are at a normal weight and have a low risk for diabetes.  More often and at a younger age if you are overweight or have a high risk for diabetes. What should I know about preventing infection? Hepatitis B If you have a higher risk for hepatitis B, you should be screened for this virus. Talk with your health care provider to find out if you are at risk for hepatitis B infection. Hepatitis C Testing is recommended for:  Everyone born from 1945 through 1965.  Anyone with known risk factors for hepatitis C. Sexually transmitted infections (STIs)  Get screened for STIs, including gonorrhea and chlamydia, if: ? You are sexually active and are younger than 39 years of age. ? You are older than 39 years of age and your health care provider tells you that you are at risk for this type of infection. ? Your sexual activity has changed since you were last screened, and you are at increased risk for chlamydia or gonorrhea. Ask your health care provider if   you are at risk.  Ask your health care provider about whether you are at high risk for HIV. Your health care provider may recommend a prescription medicine to help prevent HIV infection. If you choose to take medicine to prevent HIV, you should first get tested for HIV. You should then be tested every 3 months for as long as you are taking the medicine. Pregnancy  If you are about to stop having your period (premenopausal) and  you may become pregnant, seek counseling before you get pregnant.  Take 400 to 800 micrograms (mcg) of folic acid every day if you become pregnant.  Ask for birth control (contraception) if you want to prevent pregnancy. Osteoporosis and menopause Osteoporosis is a disease in which the bones lose minerals and strength with aging. This can result in bone fractures. If you are 65 years old or older, or if you are at risk for osteoporosis and fractures, ask your health care provider if you should:  Be screened for bone loss.  Take a calcium or vitamin D supplement to lower your risk of fractures.  Be given hormone replacement therapy (HRT) to treat symptoms of menopause. Follow these instructions at home: Lifestyle  Do not use any products that contain nicotine or tobacco, such as cigarettes, e-cigarettes, and chewing tobacco. If you need help quitting, ask your health care provider.  Do not use street drugs.  Do not share needles.  Ask your health care provider for help if you need support or information about quitting drugs. Alcohol use  Do not drink alcohol if: ? Your health care provider tells you not to drink. ? You are pregnant, may be pregnant, or are planning to become pregnant.  If you drink alcohol: ? Limit how much you use to 0-1 drink a day. ? Limit intake if you are breastfeeding.  Be aware of how much alcohol is in your drink. In the [place] one drink equals one 12 oz bottle of beer (355 mL), one 5 oz glass of wine (148 mL), or one 1 oz glass of hard liquor (44 mL). General instructions  Schedule regular health, dental, and eye exams.  Stay current with your vaccines.  Tell your health care provider if: ? You often feel depressed. ? You have ever been abused or do not feel safe at home. Summary  Adopting a healthy lifestyle and getting preventive care are important in promoting health and wellness.  Follow your health care provider's instructions about healthy  diet, exercising, and getting tested or screened for diseases.  Follow your health care provider's instructions on monitoring your cholesterol and blood pressure. This information is not intended to replace advice given to you by your health care provider. Make sure you discuss any questions you have with your health care provider. Document Revised: 01/31/2018 Document Reviewed: 01/31/2018 Elsevier Patient Education  2020 [employer]  

## 2019-12-11 NOTE — Telephone Encounter (Signed)
Referral placed at [facility] in epic they will call to schedule.

## 2019-12-11 NOTE — Telephone Encounter (Signed)
-----   Message from Olivia Mackie, NP sent at 12/11/2019  9:58 AM EDT ----- Please send referral to GI for evaluation of possible gluten intolerance/IBS. Thank you

## 2019-12-12 ENCOUNTER — Encounter: Payer: BC Managed Care – PPO | Admitting: Gynecology

## 2019-12-12 LAB — PAP, TP IMAGING W/ HPV RNA, RFLX HPV TYPE 16,18/45: HPV DNA High Risk: NOT DETECTED

## 2019-12-12 LAB — COMPREHENSIVE METABOLIC PANEL
AG Ratio: 1.5 (calc) (ref 1.0–2.5)
ALT: 11 U/L (ref 6–29)
AST: 16 U/L (ref 10–30)
Albumin: 4.6 g/dL (ref 3.6–5.1)
Alkaline phosphatase (APISO): 105 U/L (ref 31–125)
BUN: 13 mg/dL (ref 7–25)
CO2: 27 mmol/L (ref 20–32)
Calcium: 9.3 mg/dL (ref 8.6–10.2)
Chloride: 104 mmol/L (ref 98–110)
Creat: 0.69 mg/dL (ref 0.50–1.10)
Globulin: 3 g/dL (calc) (ref 1.9–3.7)
Glucose, Bld: 83 mg/dL (ref 65–99)
Potassium: 4.5 mmol/L (ref 3.5–5.3)
Sodium: 138 mmol/L (ref 135–146)
Total Bilirubin: 0.6 mg/dL (ref 0.2–1.2)
Total Protein: 7.6 g/dL (ref 6.1–8.1)

## 2019-12-12 LAB — CBC WITH DIFFERENTIAL/PLATELET
Absolute Monocytes: 496 cells/uL (ref 200–950)
Basophils Absolute: 32 cells/uL (ref 0–200)
Basophils Relative: 0.4 %
Eosinophils Absolute: 56 cells/uL (ref 15–500)
Eosinophils Relative: 0.7 %
HCT: 41.1 % (ref 35.0–45.0)
Hemoglobin: 13.7 g/dL (ref 11.7–15.5)
Lymphs Abs: 2120 cells/uL (ref 850–3900)
MCH: 29.7 pg (ref 27.0–33.0)
MCHC: 33.3 g/dL (ref 32.0–36.0)
MCV: 89.2 fL (ref 80.0–100.0)
MPV: 11 fL (ref 7.5–12.5)
Monocytes Relative: 6.2 %
Neutro Abs: 5296 cells/uL (ref 1500–7800)
Neutrophils Relative %: 66.2 %
Platelets: 291 10*3/uL (ref 140–400)
RBC: 4.61 10*6/uL (ref 3.80–5.10)
RDW: 12.3 % (ref 11.0–15.0)
Total Lymphocyte: 26.5 %
WBC: 8 10*3/uL (ref 3.8–10.8)

## 2019-12-12 LAB — LIPID PANEL
Cholesterol: 216 mg/dL — ABNORMAL HIGH (ref <200)
HDL: 57 mg/dL (ref >=50)
LDL Cholesterol (Calc): 140 mg/dL (calc) — ABNORMAL HIGH
Non-HDL Cholesterol (Calc): 159 mg/dL (calc) — ABNORMAL HIGH (ref <130)
Total CHOL/HDL Ratio: 3.8 (calc) (ref <5.0)
Triglycerides: 93 mg/dL (ref <150)

## 2019-12-23 NOTE — Telephone Encounter (Signed)
I called [facility] to confirm they can see the referral, no documentation patient has been called. I called and confirmed they do see referral and a team member will reach out to schedule.

## 2019-12-25 ENCOUNTER — Ambulatory Visit
Admission: RE | Admit: 2019-12-25 | Discharge: 2019-12-25 | Disposition: A | Discharge status: Home / Self Care | Payer: BC Managed Care – PPO | Source: Ambulatory Visit | Attending: Nurse Practitioner | Admitting: Nurse Practitioner

## 2019-12-25 ENCOUNTER — Other Ambulatory Visit: Payer: Self-pay

## 2019-12-25 DIAGNOSIS — Z1231 Encounter for screening mammogram for malignant neoplasm of breast: Secondary | ICD-10-CM

## 2019-12-30 DIAGNOSIS — M722 Plantar fascial fibromatosis: Secondary | ICD-10-CM | POA: Diagnosis not present

## 2019-12-30 NOTE — Telephone Encounter (Signed)
I called and spoke with office patient has not been called to schedule. I spoke with a scheduler and she called and left message for patient to call back to schedule.

## 2020-01-02 ENCOUNTER — Encounter: Payer: Self-pay | Admitting: Gastroenterology

## 2020-01-06 NOTE — Telephone Encounter (Signed)
Patient scheduled on 02/24/20 with Dr.Jacobs

## 2020-01-23 DIAGNOSIS — J40 Bronchitis, not specified as acute or chronic: Secondary | ICD-10-CM | POA: Diagnosis not present

## 2020-01-23 DIAGNOSIS — R0789 Other chest pain: Secondary | ICD-10-CM | POA: Diagnosis not present

## 2020-01-23 DIAGNOSIS — J0100 Acute maxillary sinusitis, unspecified: Secondary | ICD-10-CM | POA: Diagnosis not present

## 2020-02-24 ENCOUNTER — Encounter: Payer: Self-pay | Admitting: Gastroenterology

## 2020-02-24 ENCOUNTER — Ambulatory Visit: Payer: BC Managed Care – PPO | Admitting: Gastroenterology

## 2020-02-24 ENCOUNTER — Other Ambulatory Visit (INDEPENDENT_AMBULATORY_CARE_PROVIDER_SITE_OTHER): Payer: BC Managed Care – PPO

## 2020-02-24 DIAGNOSIS — R197 Diarrhea, unspecified: Secondary | ICD-10-CM

## 2020-02-24 DIAGNOSIS — K5900 Constipation, unspecified: Secondary | ICD-10-CM

## 2020-02-24 DIAGNOSIS — R109 Unspecified abdominal pain: Secondary | ICD-10-CM

## 2020-02-24 LAB — SEDIMENTATION RATE: Sed Rate: 20 mm/hr (ref 0–20)

## 2020-02-24 LAB — C-REACTIVE PROTEIN: CRP: <1 mg/dL (ref 0.5–20.0)

## 2020-02-24 LAB — TSH: TSH: 2.79 u[IU]/mL (ref 0.35–4.50)

## 2020-02-24 NOTE — Progress Notes (Signed)
HPI: This is a very pleasant 40 year old woman who was referred to me by Loyal Jacobson, MD  to evaluate abdominal pain, intermittent diarrhea, gassiness.    She clearly has a component of lactose intolerance by history.  Her husband does as well and her kids do.  She tries to avoid cows milk as best that she can.  She periodically eats cheese and pays for it with significant abdominal discomfort and diarrhea.  She will every once in a while have a milkshake and this causes extreme symptoms.  This is been a problem of hers for many many years.  To add to this since her hysterectomy 3 or 4 years ago she has had chronic intermittent diarrhea and abdominal pains.  These occur 3 times a week on average.  During those days she will have 4 or 5 fairly urgent loose stools.  Never bloody.  The abdominal pains are crampy like, lower abdomen both sides.  They occur after eating but no particular types of food.  She has been very scientific and has really tried to narrow down what kind of foods cause her symptoms and there is truly seems to be no rhyme or reason to it.  Her weight is stable despite repeated attempts to lose weight.  Prior to her hysterectomy she was significantly overusing NSAIDs, taking up to 1600 mg of ibuprofen twice daily for several months.    Old Data Reviewed: Blood work October 2021 CBC and complete metabolic profile were both completely normal    Review of systems: Pertinent positive and negative review of systems were noted in the above HPI section. All other review negative.   Past Medical History:  Diagnosis Date  . Anemia   . Arthritis    right thumb  . Chlamydia 2006, 2008  . CIN II (cervical intraepithelial neoplasia II) 2004  . Complication of anesthesia    tubal ligation woke up in a lot of pain  . Endometriosis 2015   fulgurated at laparoscopy  . Fracture of unspecified phalanx of right thumb, initial encounter for closed fracture   . GERD  (gastroesophageal reflux disease)   . Headache    from megestrol  . HSV-2 (herpes simplex virus 2) infection   . Overactive bladder   . Pneumonia    12/2015  . Seasonal allergies   . Urinary incontinence     Past Surgical History:  Procedure Laterality Date  . CERVICAL BIOPSY  W/ LOOP ELECTRODE EXCISION  2002   x2  . CKC  2004  . COLPOSCOPY    . CYSTOSCOPY N/A 04/26/2016   Procedure: CYSTOSCOPY;  Surgeon: [name], MD;  Location: [facility];  Service: Gynecology;  Laterality: N/A;  . LAPAROSCOPIC TUBAL LIGATION N/A 11/21/2013   Procedure: LAPAROSCOPIC TUBAL LIGATION WITH FALLOP RINGS AND FULGERATION OF ENDOMETRIOSIS;  Surgeon: [name], MD;  Location: [facility];  Service: Gynecology;  Laterality: N/A;  . LAPAROSCOPIC VAGINAL HYSTERECTOMY WITH SALPINGECTOMY Bilateral 04/26/2016   Procedure: LAPAROSCOPIC ASSISTED VAGINAL HYSTERECTOMY WITH SALPINGECTOMY;  Surgeon: [name], MD;  Location: [facility];  Service: Gynecology;  Laterality: Bilateral;  request 7:30am OR time  Requests 2 1/2 hours OR time  . TUBAL LIGATION      Current Outpatient Medications  Medication Sig Dispense Refill  . pseudoephedrine-acetaminophen (TYLENOL SINUS) 30-500 MG TABS tablet Take 1 tablet by mouth as needed.    . valACYclovir (VALTREX) 500 MG tablet TAKE 1 TABLET BY MOUTH TWICE DAILY FOR 3-5 DAYS AS NEEDED (Patient  not taking: Reported on 02/24/2020) 30 tablet 3   No current facility-administered medications for this visit.    Allergies as of 02/24/2020 - Review Complete 02/24/2020  Allergen Reaction Noted  . Latex Dermatitis     Family History  Problem Relation Age of Onset  . Breast cancer Mother 53  . Lung cancer Father     Social History   Socioeconomic History  . Marital status: Married    Spouse name: Not on file  . Number of children: 2  . Years of education: Not on file  . Highest education level: Not on file  Occupational History  . Occupation: [employer]    Tobacco Use  . Smoking status: Never Smoker  . Smokeless tobacco: Never Used  Vaping Use  . Vaping Use: Never used  Substance and Sexual Activity  . Alcohol use: Not Currently    Alcohol/week: 0.0 standard drinks  . Drug use: No  . Sexual activity: Yes    Birth control/protection: Surgical, None    Comment: 1st intercourse 40 yo-Fewer than 5 partners  Other Topics Concern  . Not on file  Social History Narrative  . Not on file   Social Determinants of Health   Financial Resource Strain: Not on file  Food Insecurity: Not on file  Transportation Needs: Not on file  Physical Activity: Not on file  Stress: Not on file  Social Connections: Not on file  Intimate Partner Violence: Not on file     Physical Exam: BP 112/64 (BP Location: Left Arm, Patient Position: Sitting, Cuff Size: Normal)   Pulse 72   Ht 5' 2.5" (1.588 m) Comment: height measured without shoes  Wt 162 lb 6 oz (73.7 kg)   LMP 03/21/2016   BMI 29.23 kg/m  Constitutional: generally well-appearing Psychiatric: alert and oriented x3 Eyes: extraocular movements intact Mouth: oral pharynx moist, no lesions Neck: supple no lymphadenopathy Cardiovascular: heart regular rate and rhythm Lungs: clear to auscultation bilaterally Abdomen: soft, nontender, nondistended, no obvious ascites, no peritoneal signs, normal bowel sounds Extremities: no lower extremity edema bilaterally Skin: no lesions on visible extremities   Assessment and plan: 40 y.o. female with chronic intermittent diarrhea and abdominal pains, chronic lactose intolerance  First she clearly has lactose intolerance history and I recommended a trial of Lactaid brand supplements titrating to effect.  Second I think there is probably more to the story than just her lactose intolerance.  I think most likely she has IBS D but I recommended we do some testing to rule out other etiologies such as celiac sprue, inflammatory bowel disease, thyroid  abnormalities.  I recommended lab testing with sed rate, CRP, thyroid testing, celiac sprue serologies as well as upper endoscopy to check for gastritis, peptic ulcer disease, chronic stricturing from her history of NSAID abuse, colonoscopy to check for possible underlying inflammatory bowel disease.  She understands I think it is very unlikely that cancer explains her symptoms.  Please see the "Patient Instructions" section for addition details about the plan.   Rob Bunting, MD [facility] 02/24/2020, 2:56 PM  Cc: Loyal Jacobson, MD  Total time on date of encounter was 45  minutes (this included time spent preparing to see the patient reviewing records; obtaining and/or reviewing separately obtained history; performing a medically appropriate exam and/or evaluation; counseling and educating the patient and family if present; ordering medications, tests or procedures if applicable; and documenting clinical information in the health record).

## 2020-02-24 NOTE — Patient Instructions (Addendum)
If you are age 40 or older, your body mass index should be between 23-30. Your Body mass index is 29.23 kg/m. If this is out of the aforementioned range listed, please consider follow up with your Primary Care Provider.  If you are age 17 or younger, your body mass index should be between 19-25. Your Body mass index is 29.23 kg/m. If this is out of the aformentioned range listed, please consider follow up with your Primary Care Provider.   Your provider has requested that you go to the basement level for lab work before leaving today. Press "B" on the elevator. The lab is located at the first door on the left as you exit the elevator.  You have been scheduled for an endoscopy and colonoscopy. Please follow the written instructions given to you at your visit today. Please pick up your prep supplies at the pharmacy within the next 1-3 days. If you use inhalers (even only as needed), please bring them with you on the day of your procedure.  Due to recent changes in healthcare laws, you may see the results of your imaging and laboratory studies on MyChart before your provider has had a chance to review them.  We understand that in some cases there may be results that are confusing or concerning to you. Not all laboratory results come back in the same time frame and the provider may be waiting for multiple results in order to interpret others.  Please give [place] 48 hours in order for your provider to thoroughly review all the results before contacting the office for clarification of your results.   A trial of Lactaid brand supplements is recommended for lactose intolerance or while consuming dairy in your diet.  Thank you for entrusting me with your care and choosing [facility].  Dr Christella Hartigan

## 2020-02-25 LAB — TISSUE TRANSGLUTAMINASE, IGA: (tTG) Ab, IgA: <1 U/mL

## 2020-02-25 LAB — IGA: Immunoglobulin A: 154 mg/dL (ref 47–310)

## 2020-03-05 ENCOUNTER — Other Ambulatory Visit: Payer: Self-pay

## 2020-03-14 DIAGNOSIS — Z20822 Contact with and (suspected) exposure to covid-19: Secondary | ICD-10-CM | POA: Diagnosis not present

## 2020-03-17 ENCOUNTER — Telehealth: Payer: Self-pay | Admitting: *Deleted

## 2020-03-17 DIAGNOSIS — B009 Herpesviral infection, unspecified: Secondary | ICD-10-CM

## 2020-03-17 MED ORDER — VALACYCLOVIR HCL 500 MG PO TABS: ORAL_TABLET | ORAL | 0 refills | Status: AC

## 2020-03-17 NOTE — Telephone Encounter (Signed)
Patient called requesting refill on Valtrex c/o current outbreak. Rx sent.

## 2020-04-13 DIAGNOSIS — X32XXXS Exposure to sunlight, sequela: Secondary | ICD-10-CM | POA: Diagnosis not present

## 2020-04-13 DIAGNOSIS — L918 Other hypertrophic disorders of the skin: Secondary | ICD-10-CM | POA: Diagnosis not present

## 2020-04-13 DIAGNOSIS — L814 Other melanin hyperpigmentation: Secondary | ICD-10-CM | POA: Diagnosis not present

## 2020-04-20 ENCOUNTER — Encounter: Payer: BC Managed Care – PPO | Admitting: Gastroenterology

## 2020-10-05 ENCOUNTER — Other Ambulatory Visit: Payer: Self-pay | Admitting: Nurse Practitioner

## 2020-10-05 DIAGNOSIS — Z1231 Encounter for screening mammogram for malignant neoplasm of breast: Secondary | ICD-10-CM

## 2020-12-14 ENCOUNTER — Ambulatory Visit: Payer: Self-pay | Admitting: Nurse Practitioner

## 2020-12-22 ENCOUNTER — Ambulatory Visit (INDEPENDENT_AMBULATORY_CARE_PROVIDER_SITE_OTHER): Payer: BC Managed Care – PPO | Admitting: Nurse Practitioner

## 2020-12-22 ENCOUNTER — Other Ambulatory Visit: Payer: Self-pay

## 2020-12-22 ENCOUNTER — Telehealth: Payer: Self-pay | Admitting: *Deleted

## 2020-12-22 ENCOUNTER — Encounter: Payer: Self-pay | Admitting: Nurse Practitioner

## 2020-12-22 VITALS — BP 116/74 | Ht 63.0 in | Wt 162.0 lb

## 2020-12-22 DIAGNOSIS — B009 Herpesviral infection, unspecified: Secondary | ICD-10-CM | POA: Diagnosis not present

## 2020-12-22 DIAGNOSIS — Z23 Encounter for immunization: Secondary | ICD-10-CM

## 2020-12-22 DIAGNOSIS — Z01419 Encounter for gynecological examination (general) (routine) without abnormal findings: Secondary | ICD-10-CM

## 2020-12-22 DIAGNOSIS — Z803 Family history of malignant neoplasm of breast: Secondary | ICD-10-CM

## 2020-12-22 NOTE — Telephone Encounter (Signed)
Referral sent  I will close encounter once appointment is scheduled

## 2020-12-22 NOTE — Telephone Encounter (Signed)
-----   Message from Olivia Mackie, NP sent at 12/22/2020  4:25 PM EDT ----- Please send referral for genetic testing. Mother diagnosed with breast cancer at age 40.

## 2020-12-22 NOTE — Progress Notes (Signed)
   [name] January 24, 1981 [id number]   History:  40 y.o. [id number] presents for annual exam without GYN complaints. 2018 LAVH with bilateral salpingectomies for menorrhagia. 2002 LEEP x 2 for CIN-2, subsequent paps normal. History of HSV-2, OAB.   Gynecologic History Patient's last menstrual period was 03/21/2016.   Contraception/Family planning: status post hysterectomy Sexually active: Yes  Health Maintenance Last Pap: 12/11/2019. Results were: Normal Last mammogram: 12/25/2019. Results were: Normal Last colonoscopy: Not indicated Last Dexa: Not indicated  Past medical history, past surgical history, family history and social history were all reviewed and documented in the EPIC chart. Married. Works for [employer]. 59 yo daughter, 75 yo son. Mother diagnosed with breast cancer at age 71.   ROS:  A ROS was performed and pertinent positives and negatives are included.  Exam:  Vitals:   12/22/20 1559  BP: 116/74  Weight: 162 lb (73.5 kg)  Height: 5\' 3"  (1.6 m)    Body mass index is 28.7 kg/m.  General appearance:  Normal Thyroid:  Symmetrical, normal in size, without palpable masses or nodularity. Respiratory  Auscultation:  Clear without wheezing or rhonchi Cardiovascular  Auscultation:  Regular rate, without rubs, murmurs or gallops  Edema/varicosities:  Not grossly evident Abdominal  Soft,nontender, without masses, guarding or rebound.  Liver/spleen:  No organomegaly noted  Hernia:  None appreciated  Skin  Inspection:  Grossly normal   Breasts: Examined lying and sitting.   Right: Without masses, retractions, discharge or axillary adenopathy.   Left: Without masses, retractions, discharge or axillary adenopathy. Genitourinary   Inguinal/mons:  Normal without inguinal adenopathy  External genitalia:  Normal appearing vulva with no masses, tenderness, or lesions  BUS/Urethra/Skene's glands:  Normal  Vagina:  Normal appearing with normal color and  discharge, no lesions  Cervix:  Absent  Uterus:  Absent  Adnexa/parametria:     Rt: Normal in size, without masses or tenderness.   Lt: Normal in size, without masses or tenderness.  Anus and perineum: Normal  Digital rectal exam: Normal sphincter tone without palpated masses or tenderness  Patient informed chaperone available to be present for breast and pelvic exam. Patient has requested no chaperone to be present. Patient has been advised what will be completed during breast and pelvic exam.    Assessment/Plan:  40 y.o. 41 for annual exam.   Well female exam with routine gynecological exam - Education provided on SBEs, importance of preventative screenings, current guidelines, high calcium diet, regular exercise, and multivitamin daily.  Labs done recently through life insurance agency, will have them sent to here.   HSV-2 (herpes simplex virus 2) infection - Takes Valtrex as needed for outbreaks.  Need for immunization against influenza - Plan: Flu Vaccine QUAD 7mo+IM (Fluarix, Fluzone & Alfiuria Quad PF)  Family history of breast cancer in mother - mother diagnosed with breast cancer at age 51. Estranged. Patient interested in genetic testing. Will send referral.   Screening for cervical cancer - 2002 LEEP x 2 for CIN-2, subsequent paps normal. Will repeat at 5-year interval per guidelines.  Screening for breast cancer - Normal mammogram history.  Mammogram scheduled for this week. Normal breast exam today.  Follow up in 1 year for annual.      [address] [facility], 4:26 PM 12/22/2020

## 2020-12-23 ENCOUNTER — Telehealth: Payer: Self-pay | Admitting: Licensed Clinical Social Worker

## 2020-12-23 NOTE — Telephone Encounter (Signed)
Scheduled appt per 11/1 referral. Pt is aware of appt date and time.  

## 2020-12-25 ENCOUNTER — Other Ambulatory Visit: Payer: Self-pay

## 2020-12-25 ENCOUNTER — Ambulatory Visit
Admission: RE | Admit: 2020-12-25 | Discharge: 2020-12-25 | Disposition: A | Discharge status: Home / Self Care | Payer: BC Managed Care – PPO | Source: Ambulatory Visit | Attending: Nurse Practitioner | Admitting: Nurse Practitioner

## 2020-12-25 DIAGNOSIS — Z1231 Encounter for screening mammogram for malignant neoplasm of breast: Secondary | ICD-10-CM | POA: Diagnosis not present

## 2021-01-05 NOTE — Telephone Encounter (Signed)
Appt 01/18/2021 

## 2021-01-18 ENCOUNTER — Inpatient Hospital Stay: Payer: BC Managed Care – PPO | Attending: Licensed Clinical Social Worker | Admitting: Licensed Clinical Social Worker

## 2021-01-18 ENCOUNTER — Inpatient Hospital Stay: Payer: BC Managed Care – PPO

## 2021-01-18 DIAGNOSIS — Z803 Family history of malignant neoplasm of breast: Secondary | ICD-10-CM | POA: Insufficient documentation

## 2021-01-18 NOTE — Progress Notes (Deleted)
REFERRING PROVIDER: Tamela Gammon, NP [address],  [place] [address]  PRIMARY PROVIDER:  Jefm Petty, MD  PRIMARY REASON FOR VISIT:  1. Family history of breast cancer      HISTORY OF PRESENT ILLNESS:   [name], a 40 y.o. female, was seen for a [facility] cancer genetics consultation at the request of Dr. Juleen China due to a family history of cancer.  [name] presents to clinic today to discuss the possibility of a hereditary predisposition to cancer, genetic testing, and to further clarify her future cancer risks, as well as potential cancer risks for family members.   [name] is a 40 y.o. female with no personal history of cancer.    CANCER HISTORY:  Oncology History   No history exists.     RISK FACTORS:  Menarche was at age ***.  First live birth at age ***.  OCP use for approximately {Numbers 1-12 multi-select:20307} years.  Ovaries intact: yes.  Hysterectomy: yes.  Menopausal status: {Menopause:31378}.  HRT use: {Numbers 1-12 multi-select:20307} years. Colonoscopy: {Yes/No-Ex:120004}; {normal/abnormal/not examined:14677}. Mammogram within the last year: {Yes/No-Ex:120004}. Number of breast biopsies: {Numbers 1-12 multi-select:20307}. Up to date with pelvic exams: yes. Any excessive radiation exposure in the past: {Yes/No-Ex:120004}  Past Medical History:  Diagnosis Date   Anemia    Arthritis    right thumb   Chlamydia 2006, 2008   CIN II (cervical intraepithelial neoplasia II) 2778   Complication of anesthesia    tubal ligation woke up in a lot of pain   Endometriosis 2015   fulgurated at laparoscopy   Fracture of unspecified phalanx of right thumb, initial encounter for closed fracture    GERD (gastroesophageal reflux disease)    Headache    from megestrol   HSV-2 (herpes simplex virus 2) infection    Overactive bladder    Pneumonia    12/2015   Seasonal allergies    Urinary incontinence     Past Surgical  History:  Procedure Laterality Date   CERVICAL BIOPSY  W/ LOOP ELECTRODE EXCISION  2002   x2   CKC  2004   COLPOSCOPY     CYSTOSCOPY N/A 04/26/2016   Procedure: CYSTOSCOPY;  Surgeon: Anastasio Auerbach, MD;  Location: [facility];  Service: Gynecology;  Laterality: N/A;   LAPAROSCOPIC TUBAL LIGATION N/A 11/21/2013   Procedure: LAPAROSCOPIC TUBAL LIGATION WITH FALLOP RINGS AND FULGERATION OF ENDOMETRIOSIS;  Surgeon: Anastasio Auerbach, MD;  Location: [facility];  Service: Gynecology;  Laterality: N/A;   LAPAROSCOPIC VAGINAL HYSTERECTOMY WITH SALPINGECTOMY Bilateral 04/26/2016   Procedure: LAPAROSCOPIC ASSISTED VAGINAL HYSTERECTOMY WITH SALPINGECTOMY;  Surgeon: Anastasio Auerbach, MD;  Location: [facility];  Service: Gynecology;  Laterality: Bilateral;  request 7:30am OR time  Requests 2 1/2 hours OR time   TUBAL LIGATION      Social History   Socioeconomic History   Marital status: Married    Spouse name: Not on file   Number of children: 2   Years of education: Not on file   Highest education level: Not on file  Occupational History   Occupation: [employer]   Tobacco Use   Smoking status: Never   Smokeless tobacco: Never  Vaping Use   Vaping Use: Never used  Substance and Sexual Activity   Alcohol use: Not Currently    Alcohol/week: 0.0 standard drinks   Drug use: No   Sexual activity: Yes    Birth control/protection: Surgical    Comment: 1st intercourse 40 yo-Fewer  than 5 partners  Other Topics Concern   Not on file  Social History Narrative   Not on file   Social Determinants of Health   Financial Resource Strain: Not on file  Food Insecurity: Not on file  Transportation Needs: Not on file  Physical Activity: Not on file  Stress: Not on file  Social Connections: Not on file     FAMILY HISTORY:  We obtained a detailed, 4-generation family history.  Significant diagnoses are listed below: Family History  Problem Relation Age of Onset   Breast cancer Mother 20   Lung  cancer Father    [name] has a son, 46, and a daughter, 39, no cancers. She has   [name] is {aware/unaware} of previous family history of genetic testing for hereditary cancer risks. Patient's maternal ancestors are of *** descent, and paternal ancestors are of *** descent. There {IS NO:12509} reported Ashkenazi Jewish ancestry. There {IS NO:12509} known consanguinity.  GENETIC COUNSELING ASSESSMENT: [name] is a 40 y.o. female with a family history of breast cancer which is somewhat suggestive of a hereditary cancer syndrome and predisposition to cancer. We, therefore, discussed and recommended the following at today's visit.   DISCUSSION: We discussed that approximately 10% of breast cancer is hereditary. Most cases of hereditary breast cancer are associated with BRCA1/BRCA2 genes, although there are other genes associated with hereditary breast cancer as well. Cancers and risks are gene specific.  We discussed that testing is beneficial for several reasons including knowing about other cancer risks, identifying potential screening and risk-reduction options that may be appropriate, and to understand if other family members could be at risk for cancer and allow them to undergo genetic testing.   We reviewed the characteristics, features and inheritance patterns of hereditary cancer syndromes. We also discussed genetic testing, including the appropriate family members to test, the process of testing, insurance coverage and turn-around-time for results. We discussed the implications of a negative, positive and/or variant of uncertain significant result. We recommended [name] pursue genetic testing for the [employer] CancerNext-Expanded+RNA gene panel.   The CancerNext-Expanded + RNAinsight gene panel offered by [employer] and includes sequencing and rearrangement analysis for the following 77 genes: IP, ALK, APC*, ATM*, AXIN2, BAP1, BARD1, BLM, BMPR1A, BRCA1*, BRCA2*, BRIP1*, CDC73,  CDH1*,CDK4, CDKN1B, CDKN2A, CHEK2*, CTNNA1, DICER1, FANCC, FH, FLCN, GALNT12, KIF1B, LZTR1, MAX, MEN1, MET, MLH1*, MSH2*, MSH3, MSH6*, MUTYH*, NBN, NF1*, NF2, NTHL1, PALB2*, PHOX2B, PMS2*, POT1, PRKAR1A, PTCH1, PTEN*, RAD51C*, RAD51D*,RB1, RECQL, RET, SDHA, SDHAF2, SDHB, SDHC, SDHD, SMAD4, SMARCA4, SMARCB1, SMARCE1, STK11, SUFU, TMEM127, TP53*,TSC1, TSC2, VHL and XRCC2 (sequencing and deletion/duplication); EGFR, EGLN1, HOXB13, KIT, MITF, PDGFRA, POLD1 and POLE (sequencing only); EPCAM and GREM1 (deletion/duplication only).  Based on [name] family history of cancer, she meets medical criteria for genetic testing. Despite that she meets criteria, she may still have an out of pocket cost. We discussed that if her out of pocket cost for testing is over $100, the laboratory will call and confirm whether she wants to proceed with testing.  If the out of pocket cost of testing is less than $100 she will be billed by the genetic testing laboratory.   We discussed that some people do not want to undergo genetic testing due to fear of genetic discrimination.  A federal law called the Genetic Information Non-Discrimination Act (GINA) of 2008 helps protect individuals against genetic discrimination based on their genetic test results.  It impacts both health insurance and employment.  For health insurance,  it protects against increased premiums, being kicked off insurance or being forced to take a test in order to be insured.  For employment it protects against hiring, firing and promoting decisions based on genetic test results.  Health status due to a cancer diagnosis is not protected under GINA.  This law does not protect life insurance, disability insurance, or other types of insurance.   PLAN: After considering the risks, benefits, and limitations, [name] provided informed consent to pursue genetic testing and the blood sample was sent to {Lab} Laboratories for analysis of the {test}. Results should be  available within approximately {TAT TIME} weeks' time, at which point they will be disclosed by telephone to [name], as will any additional recommendations warranted by these results. [name] will receive a summary of her genetic counseling visit and a copy of her results once available. This information will also be available in Epic.   *** Despite our recommendation, [name] did not wish to pursue genetic testing at today's visit. We understand this decision and remain available to coordinate genetic testing at any time in the future. We, therefore, recommend [name] continue to follow the cancer screening guidelines given by her primary healthcare provider.  ***Based on [name] family history, we recommended her *** have genetic counseling and testing. [name] will let us know if we can be of any assistance in coordinating genetic counseling and/or testing for this family member.   Lastly, we encouraged [name] to remain in contact with cancer genetics annually so that we can continuously update the family history and inform her of any changes in cancer genetics and testing that may be of benefit for this family.   [name] questions were answered to her satisfaction today. Our contact information was provided should additional questions or concerns arise. Thank you for the referral and allowing [place] to share in the care of your patient.   Faith Rogue, MS, [facility] Genetic Counselor [email or web address] Phone: [phone]  The patient was seen for a total of *** minutes in face-to-face genetic counseling.  ***Patient was seen alone. ***Patient brought. Dr. Grayland Ormond was available for discussion regarding this case.   _______________________________________________________________________ For Office Staff:  Number of people involved in session: *** Was an Intern/ student involved with case: {YES/NO:63}

## 2021-02-10 IMAGING — MG DIGITAL SCREENING BILAT W/ TOMO
8 series · 8 of 24 positions shown · non-contrast
Comparison: Previous exam(s).

CLINICAL DATA: Screening. Mother diagnosed with breast cancer at
age 36.

EXAM:
DIGITAL SCREENING BILATERAL MAMMOGRAM WITH TOMO AND CAD

[R MLO synth-2D]
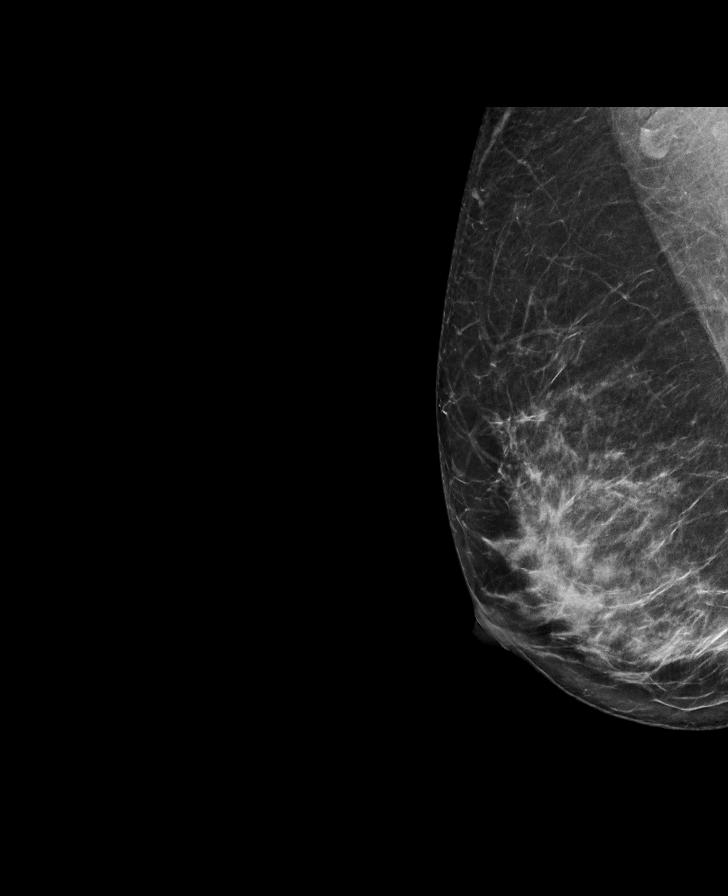

[L CC synth-2D]
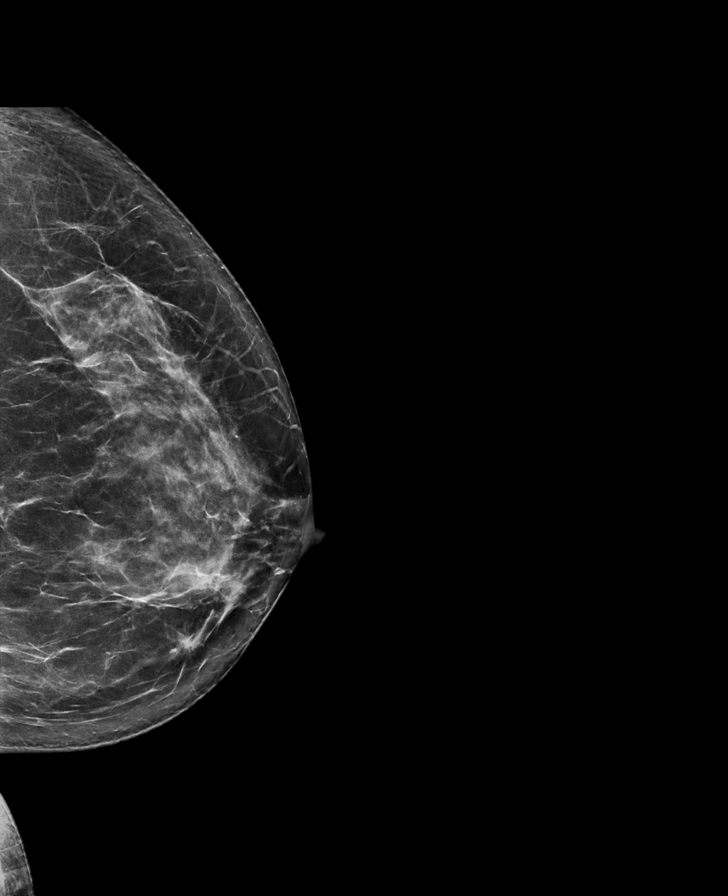

[L MLO synth-2D]
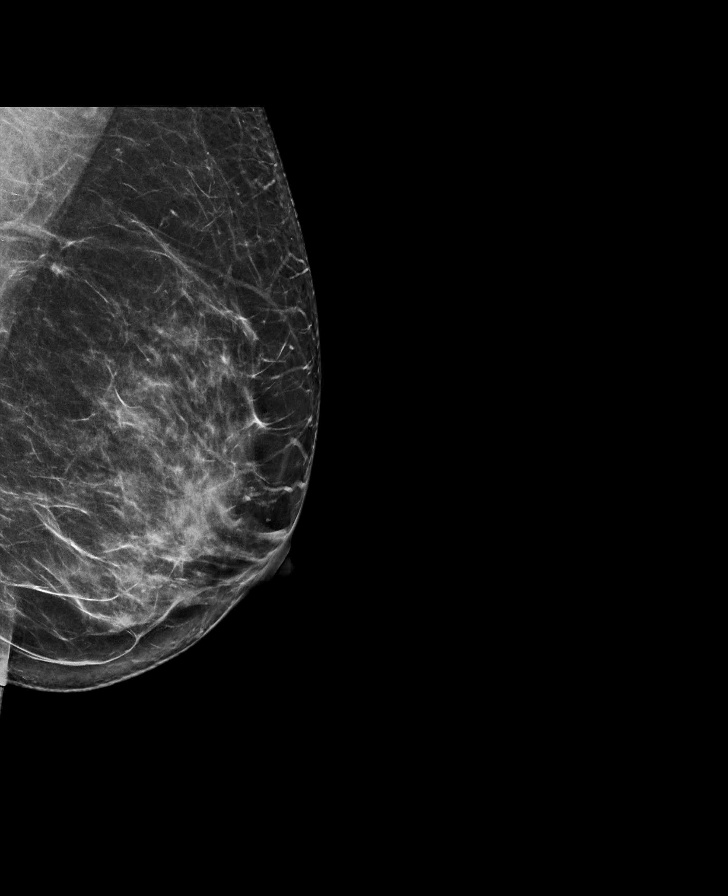

[R CC synth-2D]
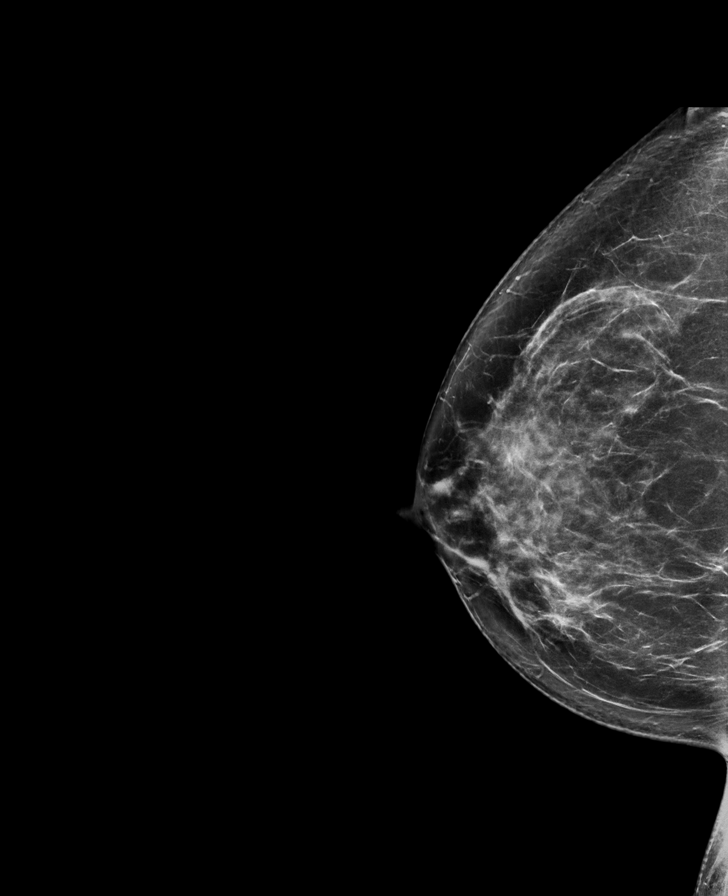

[L MLO tomo · tomo slice 39/78.0]
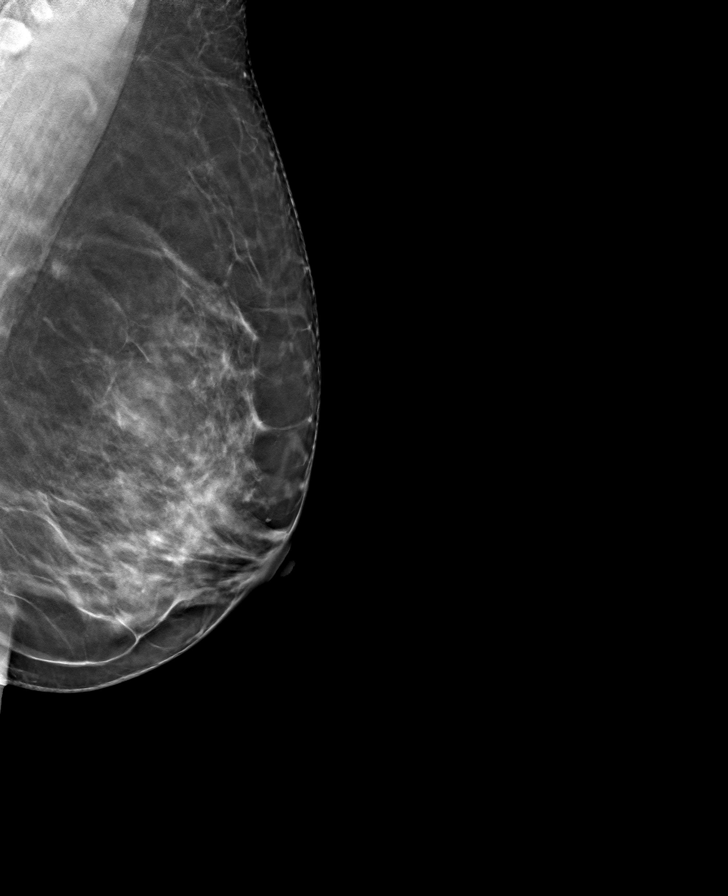

[L CC tomo · tomo slice 40/79.0]
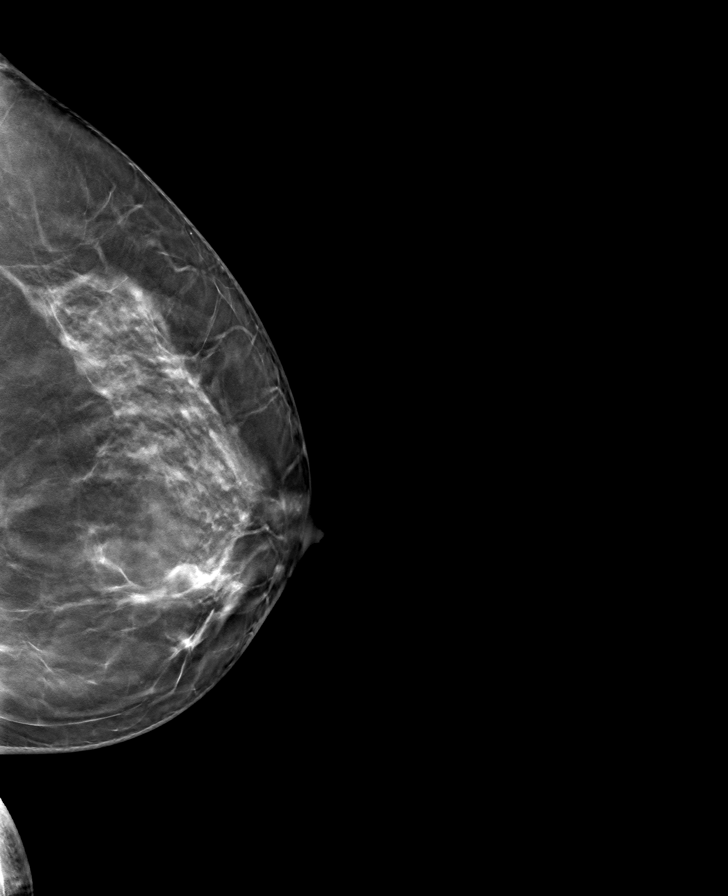

[R MLO tomo · tomo slice 39/77.0]
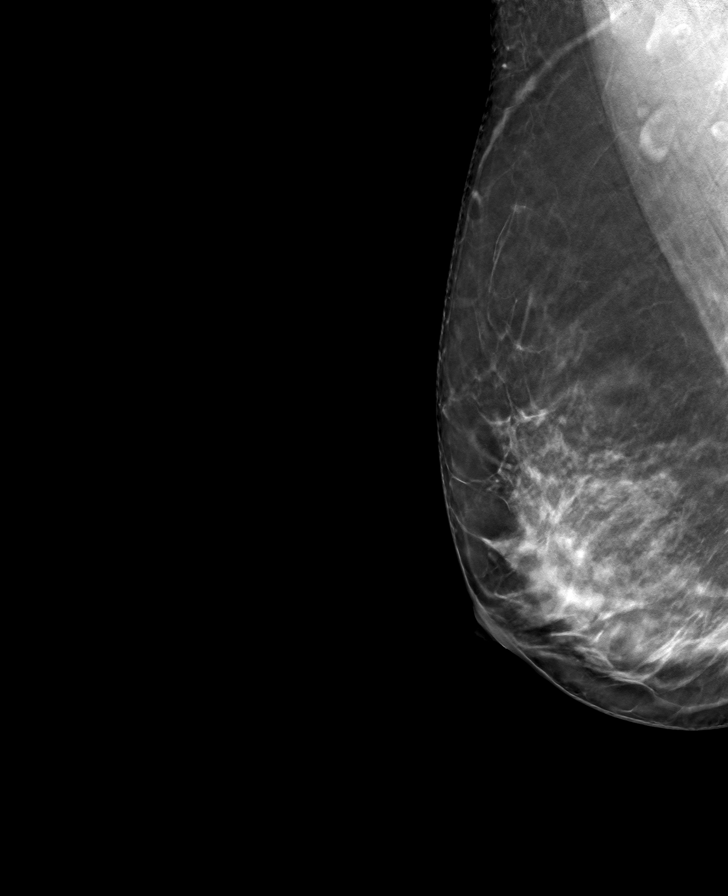

[R CC tomo · tomo slice 43/84.0]
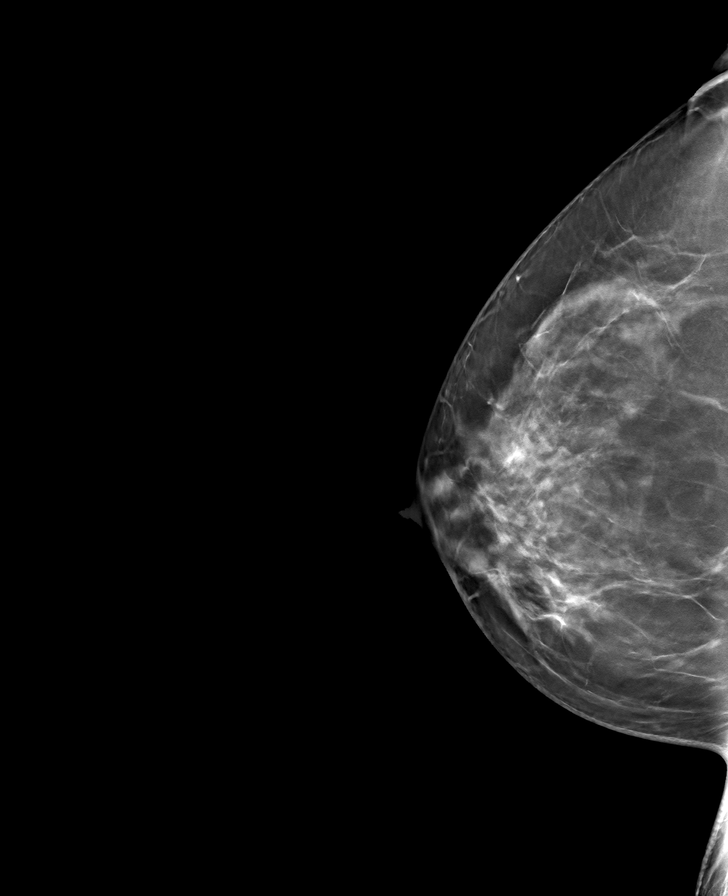

[8 of 24 positions shown; findings below may reference images not displayed]

ACR Breast Density Category c: The breast tissue is heterogeneously
dense, which may obscure small masses.
FINDINGS: There are no findings suspicious for malignancy. Images were
processed with CAD.
IMPRESSION: No mammographic evidence of malignancy. A result letter of this
screening mammogram will be mailed directly to the patient.

RECOMMENDATION:
Screening mammogram in one year.(Code:9O-[id number])

BI-RADS CATEGORY  1: Negative.

## 2021-03-10 DIAGNOSIS — R059 Cough, unspecified: Secondary | ICD-10-CM | POA: Diagnosis not present

## 2021-03-10 DIAGNOSIS — Z20822 Contact with and (suspected) exposure to covid-19: Secondary | ICD-10-CM | POA: Diagnosis not present

## 2021-03-10 DIAGNOSIS — J069 Acute upper respiratory infection, unspecified: Secondary | ICD-10-CM | POA: Diagnosis not present

## 2021-06-03 ENCOUNTER — Encounter: Payer: Self-pay | Admitting: Nurse Practitioner

## 2021-06-03 ENCOUNTER — Ambulatory Visit: Payer: BC Managed Care – PPO | Admitting: Nurse Practitioner

## 2021-06-03 VITALS — BP 134/84

## 2021-06-03 DIAGNOSIS — R635 Abnormal weight gain: Secondary | ICD-10-CM | POA: Diagnosis not present

## 2021-06-03 DIAGNOSIS — L659 Nonscarring hair loss, unspecified: Secondary | ICD-10-CM

## 2021-06-03 DIAGNOSIS — R232 Flushing: Secondary | ICD-10-CM | POA: Diagnosis not present

## 2021-06-03 DIAGNOSIS — R350 Frequency of micturition: Secondary | ICD-10-CM | POA: Diagnosis not present

## 2021-06-03 DIAGNOSIS — R5383 Other fatigue: Secondary | ICD-10-CM

## 2021-06-03 DIAGNOSIS — E559 Vitamin D deficiency, unspecified: Secondary | ICD-10-CM | POA: Diagnosis not present

## 2021-06-03 DIAGNOSIS — E785 Hyperlipidemia, unspecified: Secondary | ICD-10-CM | POA: Diagnosis not present

## 2021-06-03 NOTE — Progress Notes (Signed)
? ?  Acute Office Visit ? ?Subjective:  ? ? Patient ID: [name], female    DOB: [birth year], 41 y.o.   MRN: [id number] ? ? ?HPI ?41 y.o. presents today with multiple complaints. She has had a 15-20 pound weight gain over 2 months. She has been very diligent with her diet and exercise routine with no changes in weight. She is also experiencing multiple hot flashes a day, hair loss, acne, and fatigue. She reports frequent urination. S/P TVH with bilateral salpingectomies in 2018. Denies changes in stress, medications, sleep. She does report eating way less than she used to.  ? ? ?Review of Systems  ?Constitutional:  Positive for fatigue and unexpected weight change.  ?Endocrine: Positive for heat intolerance.  ?Genitourinary:  Positive for frequency. Negative for difficulty urinating, dysuria, flank pain, hematuria and urgency.  ?Skin:   ?     Acne along chin/jaw line  ? ?   ?Objective:  ?  ?Physical Exam ?Constitutional:   ?   Appearance: Normal appearance. She is obese.  ?Skin: ?   Findings: Acne (Very mild along chin) present.  ?GU: Not indicated ? ?BP 134/84   LMP 03/21/2016  ?Wt Readings from Last 3 Encounters:  ?12/22/20 162 lb (73.5 kg)  ?02/24/20 162 lb 6 oz (73.7 kg)  ?12/11/19 164 lb (74.4 kg)  ? ? ?   ? ?Patient informed chaperone available to be present for breast and pelvic exam. Patient has requested no chaperone to be present. Patient has been advised what will be completed during breast and pelvic exam.  ? ?UA negative ? ?Assessment & Plan:  ? ?Problem List Items Addressed This Visit   ?None ?Visit Diagnoses   ? ? Frequent urination    -  Primary  ? Relevant Orders  ? Urinalysis,Complete w/RFL Culture  ? Hot flashes      ? Relevant Orders  ? TSH  ? Estradiol  ? Follicle stimulating hormone  ? Fatigue, unspecified type      ? Relevant Orders  ? CBC with Differential/Platelet  ? Comprehensive metabolic panel  ? VITAMIN D 25 Hydroxy (Vit-D Deficiency, Fractures)  ? Vitamin B12  ? Estradiol  ? Follicle  stimulating hormone  ? Hair loss      ? Relevant Orders  ? CBC with Differential/Platelet  ? Comprehensive metabolic panel  ? TSH  ? VITAMIN D 25 Hydroxy (Vit-D Deficiency, Fractures)  ? Vitamin B12  ? Estradiol  ? Follicle stimulating hormone  ? Weight gain      ? Relevant Orders  ? TSH  ? Lipid panel  ? Hemoglobin A1c  ? ?  ? ?Plan: Will check labs today. She is most frustrated with hair loss and weight gain. She is already taking collagen, increase protein intake, does not tolerate biotin. Will check for deficiencies, TSH, hormone levels. We did have brief discussion on weight loss management options if labs normal. Negative UA, culture pending. She is agreeable to plan.  ? ? ? ? ?Olivia Mackie DNP, 4:17 PM 06/03/2021 ? ?

## 2021-06-04 ENCOUNTER — Telehealth: Payer: Self-pay

## 2021-06-04 LAB — CBC WITH DIFFERENTIAL/PLATELET
Absolute Monocytes: 538 {cells}/uL (ref 200–950)
Basophils Absolute: 31 {cells}/uL (ref 0–200)
Basophils Relative: 0.4 %
Eosinophils Absolute: 47 {cells}/uL (ref 15–500)
Eosinophils Relative: 0.6 %
HCT: 39.6 % (ref 35.0–45.0)
Hemoglobin: 13.4 g/dL (ref 11.7–15.5)
Lymphs Abs: 2215 {cells}/uL (ref 850–3900)
MCH: 29.9 pg (ref 27.0–33.0)
MCHC: 33.8 g/dL (ref 32.0–36.0)
MCV: 88.4 fL (ref 80.0–100.0)
MPV: 10.9 fL (ref 7.5–12.5)
Monocytes Relative: 6.9 %
Neutro Abs: 4969 {cells}/uL (ref 1500–7800)
Neutrophils Relative %: 63.7 %
Platelets: 288 10*3/uL (ref 140–400)
RBC: 4.48 Million/uL (ref 3.80–5.10)
RDW: 12.1 % (ref 11.0–15.0)
Total Lymphocyte: 28.4 %
WBC: 7.8 10*3/uL (ref 3.8–10.8)

## 2021-06-04 LAB — HEMOGLOBIN A1C
Hgb A1c MFr Bld: 5.3 % of total Hgb (ref <5.7)
Mean Plasma Glucose: 105 mg/dL
eAG (mmol/L): 5.8 mmol/L

## 2021-06-04 LAB — COMPREHENSIVE METABOLIC PANEL WITH GFR
AG Ratio: 1.5 (calc) (ref 1.0–2.5)
ALT: 11 U/L (ref 6–29)
AST: 14 U/L (ref 10–30)
Albumin: 4.4 g/dL (ref 3.6–5.1)
Alkaline phosphatase (APISO): 100 U/L (ref 31–125)
BUN: 18 mg/dL (ref 7–25)
CO2: 27 mmol/L (ref 20–32)
Calcium: 9.4 mg/dL (ref 8.6–10.2)
Chloride: 108 mmol/L (ref 98–110)
Creat: 0.78 mg/dL (ref 0.50–0.99)
Globulin: 3 g/dL (ref 1.9–3.7)
Glucose, Bld: 86 mg/dL (ref 65–99)
Potassium: 4.9 mmol/L (ref 3.5–5.3)
Sodium: 141 mmol/L (ref 135–146)
Total Bilirubin: 0.4 mg/dL (ref 0.2–1.2)
Total Protein: 7.4 g/dL (ref 6.1–8.1)

## 2021-06-04 LAB — LIPID PANEL
Cholesterol: 252 mg/dL — ABNORMAL HIGH (ref <200)
HDL: 53 mg/dL (ref >=50)
LDL Cholesterol (Calc): 168 mg/dL (calc) — ABNORMAL HIGH
Non-HDL Cholesterol (Calc): 199 mg/dL (calc) — ABNORMAL HIGH (ref <130)
Total CHOL/HDL Ratio: 4.8 (calc) (ref <5.0)
Triglycerides: 159 mg/dL — ABNORMAL HIGH (ref <150)

## 2021-06-04 LAB — VITAMIN D 25 HYDROXY (VIT D DEFICIENCY, FRACTURES): Vit D, 25-Hydroxy: 12 ng/mL — ABNORMAL LOW (ref 30–100)

## 2021-06-04 LAB — ESTRADIOL: Estradiol: <15 pg/mL

## 2021-06-04 LAB — VITAMIN B12: Vitamin B-12: 453 pg/mL (ref 200–1100)

## 2021-06-04 LAB — TSH: TSH: 2.26 m[IU]/L

## 2021-06-04 LAB — FOLLICLE STIMULATING HORMONE: FSH: 22.9 m[IU]/mL

## 2021-06-04 NOTE — Telephone Encounter (Signed)
Pt called re: lab test results. Pt notified that you are not in office today and will review/give recommendations when you return next week. Pt voiced understanding.  ?

## 2021-06-05 LAB — URINE CULTURE
MICRO NUMBER:: 13259712
Result:: NO GROWTH
SPECIMEN QUALITY:: ADEQUATE

## 2021-06-05 LAB — URINALYSIS, COMPLETE W/RFL CULTURE
Bilirubin Urine: NEGATIVE
Casts: NONE SEEN /LPF
Crystals: NONE SEEN /HPF
Glucose, UA: NEGATIVE
Hgb urine dipstick: NEGATIVE
Hyaline Cast: NONE SEEN /LPF
Ketones, ur: NEGATIVE
Leukocyte Esterase: NEGATIVE
Nitrites, Initial: NEGATIVE
Protein, ur: NEGATIVE
RBC / HPF: NONE SEEN /HPF (ref 0–2)
Specific Gravity, Urine: 1.034 (ref 1.001–1.035)
Yeast: NONE SEEN /HPF
pH: 6 (ref 5.0–8.0)

## 2021-06-05 LAB — CULTURE INDICATED

## 2021-06-07 ENCOUNTER — Other Ambulatory Visit: Payer: Self-pay | Admitting: Nurse Practitioner

## 2021-06-07 ENCOUNTER — Telehealth: Payer: Self-pay | Admitting: *Deleted

## 2021-06-07 DIAGNOSIS — R232 Flushing: Secondary | ICD-10-CM

## 2021-06-07 DIAGNOSIS — E559 Vitamin D deficiency, unspecified: Secondary | ICD-10-CM

## 2021-06-07 MED ORDER — VITAMIN D (ERGOCALCIFEROL) 1.25 MG (50000 UNIT) PO CAPS: 50000.0000 [IU] | ORAL_CAPSULE | ORAL | 0 refills | Status: AC

## 2021-06-07 NOTE — Telephone Encounter (Signed)
Patient called states she received her labs from 06/03/21 visit. Patient would like to personally speak with you via phone when you return back into the office. Patient declined to discuss anything with me. Please advise  ?

## 2021-06-08 ENCOUNTER — Other Ambulatory Visit: Payer: Self-pay | Admitting: Nurse Practitioner

## 2021-06-08 ENCOUNTER — Telehealth: Payer: Self-pay | Admitting: Nurse Practitioner

## 2021-06-08 DIAGNOSIS — E785 Hyperlipidemia, unspecified: Secondary | ICD-10-CM

## 2021-06-08 NOTE — Telephone Encounter (Signed)
Spoke with patient about lab results and management. All questions answered.  ?

## 2021-06-08 NOTE — Telephone Encounter (Signed)
Tiffany called patient and addressed the below. Encounter closed.  ?

## 2021-06-09 NOTE — Telephone Encounter (Signed)
Pt notified via mychart comment by TW.  ?

## 2021-08-27 ENCOUNTER — Other Ambulatory Visit: Payer: BC Managed Care – PPO

## 2021-08-27 DIAGNOSIS — E785 Hyperlipidemia, unspecified: Secondary | ICD-10-CM

## 2021-08-27 DIAGNOSIS — R232 Flushing: Secondary | ICD-10-CM | POA: Diagnosis not present

## 2021-08-27 DIAGNOSIS — E559 Vitamin D deficiency, unspecified: Secondary | ICD-10-CM | POA: Diagnosis not present

## 2021-08-28 LAB — LIPID PANEL
Cholesterol: 189 mg/dL (ref <200)
HDL: 42 mg/dL — ABNORMAL LOW (ref >=50)
LDL Cholesterol (Calc): 109 mg/dL (calc) — ABNORMAL HIGH
Non-HDL Cholesterol (Calc): 147 mg/dL (calc) — ABNORMAL HIGH (ref <130)
Total CHOL/HDL Ratio: 4.5 (calc) (ref <5.0)
Triglycerides: 265 mg/dL — ABNORMAL HIGH (ref <150)

## 2021-08-28 LAB — ESTRADIOL: Estradiol: 96 pg/mL

## 2021-08-28 LAB — VITAMIN D 25 HYDROXY (VIT D DEFICIENCY, FRACTURES): Vit D, 25-Hydroxy: 51 ng/mL (ref 30–100)

## 2021-08-28 LAB — FOLLICLE STIMULATING HORMONE: FSH: 24.3 m[IU]/mL

## 2021-09-05 ENCOUNTER — Other Ambulatory Visit: Payer: Self-pay | Admitting: Nurse Practitioner

## 2021-09-05 DIAGNOSIS — E559 Vitamin D deficiency, unspecified: Secondary | ICD-10-CM

## 2021-11-15 ENCOUNTER — Telehealth: Payer: Self-pay | Admitting: *Deleted

## 2021-11-15 NOTE — Patient Outreach (Signed)
  Care Coordination   11/15/2021 Name: [name] MRN: [id number] DOB: 1980-07-04   Care Coordination Outreach Attempts:  An unsuccessful telephone outreach was attempted today to offer the patient information about available care coordination services as a benefit of their health plan.   Follow Up Plan:  Additional outreach attempts will be made to offer the patient care coordination information and services.   Encounter Outcome:  No Answer  Care Coordination Interventions Activated:  No   Care Coordination Interventions:  No, not indicated    Jacqlyn Larsen [facility], [place] RN Care Coordinator [phone]

## 2021-11-16 ENCOUNTER — Other Ambulatory Visit: Payer: Self-pay | Admitting: Nurse Practitioner

## 2021-11-16 DIAGNOSIS — Z1231 Encounter for screening mammogram for malignant neoplasm of breast: Secondary | ICD-10-CM

## 2021-11-19 ENCOUNTER — Encounter: Payer: Self-pay | Admitting: *Deleted

## 2021-11-19 ENCOUNTER — Telehealth: Payer: Self-pay | Admitting: *Deleted

## 2021-11-19 NOTE — Patient Outreach (Signed)
  Care Coordination   Initial Visit Note   11/19/2021 Name: Kaylei Frink MRN: [id number] DOB: 09/02/1980  Sreshta Cressler is a 41 y.o. year old female who sees Jefm Petty, MD for primary care. I spoke with  Marygrace Drought by phone today.  What matters to the patients health and wellness today?  "to maintain"    Goals Addressed               This Visit's Progress     COMPLETED: "to maintain" (pt-stated)        Care Coordination Interventions: Patient interviewed about adult health maintenance status including  importance of yearly Annual Wellness Visit, pt reports she does have AWV yearly and is scheduled to follow up with primary care provider Marny Lowenstein on 12/25/21. Provided education about importance of taking medications as prescribed and attending all scheduled appointments. Care coordination program explained, pt agreeable to today's outreach but declines any further outreach, feels she is managing well.         SDOH assessments and interventions completed:  Yes  SDOH Interventions Today    Flowsheet Row Most Recent Value  SDOH Interventions   Food Insecurity Interventions Intervention Not Indicated  Transportation Interventions Intervention Not Indicated        Care Coordination Interventions Activated:  Yes  Care Coordination Interventions:  Yes, provided   Follow up plan: No further intervention required.   Encounter Outcome:  Pt. Visit Completed   Jacqlyn Larsen [facility], BSN [facility] RN Care Coordinator [phone]

## 2021-12-24 DIAGNOSIS — Z23 Encounter for immunization: Secondary | ICD-10-CM | POA: Diagnosis not present

## 2021-12-24 DIAGNOSIS — Z0000 Encounter for general adult medical examination without abnormal findings: Secondary | ICD-10-CM | POA: Diagnosis not present

## 2021-12-24 DIAGNOSIS — Z90710 Acquired absence of both cervix and uterus: Secondary | ICD-10-CM | POA: Diagnosis not present

## 2021-12-24 DIAGNOSIS — Z6830 Body mass index (BMI) 30.0-30.9, adult: Secondary | ICD-10-CM | POA: Diagnosis not present

## 2021-12-24 DIAGNOSIS — E559 Vitamin D deficiency, unspecified: Secondary | ICD-10-CM | POA: Diagnosis not present

## 2021-12-27 ENCOUNTER — Ambulatory Visit
Admission: RE | Admit: 2021-12-27 | Discharge: 2021-12-27 | Disposition: A | Discharge status: Home / Self Care | Payer: BC Managed Care – PPO | Source: Ambulatory Visit | Attending: Nurse Practitioner | Admitting: Nurse Practitioner

## 2021-12-27 DIAGNOSIS — Z1231 Encounter for screening mammogram for malignant neoplasm of breast: Secondary | ICD-10-CM

## 2022-06-12 ENCOUNTER — Telehealth: Payer: BC Managed Care – PPO | Admitting: Family Medicine

## 2022-06-12 DIAGNOSIS — B3731 Acute candidiasis of vulva and vagina: Secondary | ICD-10-CM

## 2022-06-12 MED ORDER — FLUCONAZOLE 150 MG PO TABS: 150.0000 mg | ORAL_TABLET | Freq: Once | ORAL | 0 refills | Status: AC

## 2022-06-12 NOTE — Progress Notes (Signed)

## 2022-10-31 ENCOUNTER — Other Ambulatory Visit: Payer: Self-pay | Admitting: Physician Assistant

## 2022-10-31 DIAGNOSIS — Z1231 Encounter for screening mammogram for malignant neoplasm of breast: Secondary | ICD-10-CM

## 2022-12-30 ENCOUNTER — Ambulatory Visit
Admission: RE | Admit: 2022-12-30 | Discharge: 2022-12-30 | Disposition: A | Discharge status: Home / Self Care | Payer: BC Managed Care – PPO | Source: Ambulatory Visit | Attending: Physician Assistant | Admitting: Physician Assistant

## 2022-12-30 DIAGNOSIS — Z1231 Encounter for screening mammogram for malignant neoplasm of breast: Secondary | ICD-10-CM

## 2023-09-26 ENCOUNTER — Other Ambulatory Visit: Payer: Self-pay | Admitting: Physician Assistant

## 2023-09-26 DIAGNOSIS — Z1231 Encounter for screening mammogram for malignant neoplasm of breast: Secondary | ICD-10-CM

## 2024-01-01 ENCOUNTER — Ambulatory Visit
Admission: RE | Admit: 2024-01-01 | Discharge: 2024-01-01 | Disposition: A | Discharge status: Home / Self Care | Source: Ambulatory Visit | Attending: Physician Assistant | Admitting: Physician Assistant

## 2024-01-01 DIAGNOSIS — Z1231 Encounter for screening mammogram for malignant neoplasm of breast: Secondary | ICD-10-CM
